# Patient Record
Sex: Male | Born: 1970 | Race: White | Hispanic: No | Marital: Married | Smoking: Former smoker
Health system: Southern US, Community
[De-identification: ages and names within clinical notes are randomized; demographics above are authoritative.]

## PROBLEM LIST (undated history)

## (undated) DIAGNOSIS — M5030 Other cervical disc degeneration, unspecified cervical region: Secondary | ICD-10-CM

## (undated) DIAGNOSIS — Z9889 Other specified postprocedural states: Secondary | ICD-10-CM

## (undated) DIAGNOSIS — G4700 Insomnia, unspecified: Secondary | ICD-10-CM

## (undated) DIAGNOSIS — M5020 Other cervical disc displacement, unspecified cervical region: Secondary | ICD-10-CM

## (undated) DIAGNOSIS — J302 Other seasonal allergic rhinitis: Secondary | ICD-10-CM

## (undated) HISTORY — PX: EYE SURGERY: SHX253

## (undated) HISTORY — DX: Insomnia, unspecified: G47.00

## (undated) HISTORY — DX: Other seasonal allergic rhinitis: J30.2

## (undated) HISTORY — DX: Other specified postprocedural states: Z98.890

---

## 2004-06-04 HISTORY — PX: LASIK: SHX215

## 2004-10-27 ENCOUNTER — Encounter: Admission: RE | Admit: 2004-10-27 | Discharge: 2004-10-27 | Payer: Self-pay | Admitting: Family Medicine

## 2007-03-12 ENCOUNTER — Ambulatory Visit: Payer: Self-pay | Admitting: Family Medicine

## 2008-05-13 ENCOUNTER — Ambulatory Visit: Payer: Self-pay | Admitting: Family Medicine

## 2010-10-10 ENCOUNTER — Ambulatory Visit (INDEPENDENT_AMBULATORY_CARE_PROVIDER_SITE_OTHER): Payer: BC Managed Care – PPO | Admitting: Family Medicine

## 2010-10-10 ENCOUNTER — Encounter: Payer: Self-pay | Admitting: Family Medicine

## 2010-10-10 VITALS — BP 130/80 | HR 84 | Temp 98.3°F | Wt 183.0 lb

## 2010-10-10 DIAGNOSIS — Z720 Tobacco use: Secondary | ICD-10-CM

## 2010-10-10 DIAGNOSIS — J410 Simple chronic bronchitis: Secondary | ICD-10-CM

## 2010-10-10 MED ORDER — AMOXICILLIN 875 MG PO TABS: 875.0000 mg | ORAL_TABLET | Freq: Two times a day (BID) | ORAL | Status: AC

## 2010-10-10 NOTE — Patient Instructions (Signed)
Take all medication. He use over-the-counter medication for coughing specifically try NyQuil at night. She not totally back to normal after 10 days, please call

## 2010-10-10 NOTE — Progress Notes (Signed)
  Subjective:    Patient ID: Manuel Johnson, male    DOB: Sep 24, 1970, 40 y.o.   MRN: [id number]  Cough This is a new problem. The current episode started more than 1 month ago. The problem has been rapidly worsening. The problem occurs constantly. The cough is productive of purulent sputum. Associated symptoms include nasal congestion, postnasal drip, rhinorrhea and shortness of breath. Pertinent negatives include no chills, ear pain or fever. Risk factors for lung disease include smoking/tobacco exposure. He has tried OTC cough suppressant for the symptoms. The treatment provided no relief.      Review of Systems  Constitutional: Positive for fatigue. Negative for fever and chills.  HENT: Positive for rhinorrhea and postnasal drip. Negative for ear pain and sneezing.   Respiratory: Positive for cough and shortness of breath.        Objective:   Physical Exam  Constitutional: He appears well-developed and well-nourished.  HENT:  Head: Normocephalic.  Neck: Normal range of motion.  Cardiovascular: Normal rate and regular rhythm.  Exam reveals no gallop.   Pulmonary/Chest: Effort normal. No respiratory distress. He has no wheezes. He has no rales.          Assessment & Plan:  Bronchitis. Cigarette abuse.

## 2012-02-12 ENCOUNTER — Ambulatory Visit (INDEPENDENT_AMBULATORY_CARE_PROVIDER_SITE_OTHER): Payer: BC Managed Care – PPO | Admitting: Family Medicine

## 2012-02-12 ENCOUNTER — Encounter: Payer: Self-pay | Admitting: Family Medicine

## 2012-02-12 VITALS — BP 110/70 | HR 81 | Wt 194.0 lb

## 2012-02-12 DIAGNOSIS — I8390 Asymptomatic varicose veins of unspecified lower extremity: Secondary | ICD-10-CM

## 2012-02-12 DIAGNOSIS — M79609 Pain in unspecified limb: Secondary | ICD-10-CM

## 2012-02-12 DIAGNOSIS — R079 Chest pain, unspecified: Secondary | ICD-10-CM

## 2012-02-12 DIAGNOSIS — M79669 Pain in unspecified lower leg: Secondary | ICD-10-CM

## 2012-02-12 NOTE — Progress Notes (Signed)
  Subjective:    Patient ID: Manuel Johnson, male    DOB: 11/02/1970, 41 y.o.   MRN: [id number]  HPI Complains of a three-week history of mid chest pain. Pain is worse with breathing and movement. He has no symptoms of indigestion. Food does not cause this to About  2 weeks ago he did have some difficulty with coughing and is coughing up whitish material. He does complain of fatigue but no fever or chills, sore throat or earache. He also complains of one-month history of right upper calf discomfort. He does have a history of varicose veins. He's had no chest pain, shortness of breath.  Review of Systems     Objective:   Physical Exam alert and in no distress. Tympanic membranes and canals are normal. Throat is clear. Tonsils are normal. Neck is supple without adenopathy or thyromegaly. Cardiac exam shows a regular sinus rhythm without murmurs or gallops. Lungs are clear to auscultation. No chest wall tenderness is noted. Exam of the right lower leg does show varicosities. They're nontender. Homans sign is negative. Exam of his knee shows no popliteal fullness or effusion.       Assessment & Plan:  Varicose veins on right leg 1. Chest pain   2. Calf pain   Use 2 Aleve twice per day for the next week and if continued difficulty, return here for reevaluation. For the calf pain, use heat to the area 20 minutes 3 times per day.

## 2012-02-12 NOTE — Patient Instructions (Addendum)
Use 2 Aleve twice per day for the next week and if continued difficulty, return here for reevaluation. For the calf pain, use heat to the area 20 minutes 3 times per day.

## 2012-09-14 ENCOUNTER — Ambulatory Visit: Payer: BC Managed Care – PPO

## 2012-09-14 ENCOUNTER — Ambulatory Visit (INDEPENDENT_AMBULATORY_CARE_PROVIDER_SITE_OTHER): Payer: BC Managed Care – PPO | Admitting: Internal Medicine

## 2012-09-14 VITALS — BP 129/87 | HR 82 | Temp 98.6°F | Resp 16 | Ht 73.75 in | Wt 193.8 lb

## 2012-09-14 DIAGNOSIS — R05 Cough: Secondary | ICD-10-CM

## 2012-09-14 DIAGNOSIS — J329 Chronic sinusitis, unspecified: Secondary | ICD-10-CM

## 2012-09-14 DIAGNOSIS — R042 Hemoptysis: Secondary | ICD-10-CM

## 2012-09-14 MED ORDER — HYDROCODONE-HOMATROPINE 5-1.5 MG/5ML PO SYRP: 5.0000 mL | ORAL_SOLUTION | Freq: Four times a day (QID) | ORAL | Status: DC | PRN

## 2012-09-14 MED ORDER — AMOXICILLIN 500 MG PO CAPS: 1000.0000 mg | ORAL_CAPSULE | Freq: Two times a day (BID) | ORAL | Status: AC

## 2012-09-14 MED ORDER — PREDNISONE 20 MG PO TABS: ORAL_TABLET | ORAL | Status: DC

## 2012-09-14 NOTE — Progress Notes (Signed)
  Subjective:    Patient ID: Manuel Johnson, male    DOB: [birth year], 42 y.o.   MRN: [id number]  HPI  The past three weeks pt complains that he has been blowing his nose and coughing up blood and thick mucous. Yesterday he says the mucous was thick and bright green. He states he has been fatigued and the pressure in his head yesterday. He reports that the morning cough is the worse. He is coughing all of the time. No fever chills or night sweats No weight loss     Review of Systems No gastrointestinal symptoms No skin rash    Objective:   Physical Exam BP 129/87  Pulse 82  Temp(Src) 98.6 F (37 C) (Oral)  Resp 16  Ht 6' 1.75" (1.873 m)  Wt 193 lb 12.8 oz (87.907 kg)  BMI 25.06 kg/m2  SpO2 98% Conjunctiva slightly injected TMs clear Nares boggy/right-sided polyp/no fluid or blood clots/purulent discharge on the right Throat clear No nodes Chest clear to auscultation/minimal wheeze with forced expiration   [facility] reading (PRIMARY) by  Dr. Merla Riches NAD      Assessment & Plan:  Problem #1 sinusitis  Problem #2 cough with hemoptysis  Problem #3 fatigue   Amoxicillin1gmbid 10d Hycodan pred 60-0 over 6 days Reck 10d --needs nasoscopy if not 100% clear

## 2012-10-20 ENCOUNTER — Ambulatory Visit: Payer: BC Managed Care – PPO

## 2012-10-20 ENCOUNTER — Ambulatory Visit (INDEPENDENT_AMBULATORY_CARE_PROVIDER_SITE_OTHER): Payer: BC Managed Care – PPO | Admitting: Family Medicine

## 2012-10-20 VITALS — BP 128/82 | HR 80 | Temp 98.2°F | Resp 17 | Ht 72.5 in | Wt 193.0 lb

## 2012-10-20 DIAGNOSIS — J45909 Unspecified asthma, uncomplicated: Secondary | ICD-10-CM

## 2012-10-20 DIAGNOSIS — R05 Cough: Secondary | ICD-10-CM

## 2012-10-20 DIAGNOSIS — R059 Cough, unspecified: Secondary | ICD-10-CM

## 2012-10-20 DIAGNOSIS — J329 Chronic sinusitis, unspecified: Secondary | ICD-10-CM

## 2012-10-20 LAB — POCT CBC
Granulocyte percent: 70.2 %G (ref 37–80)
Hemoglobin: 15.2 g/dL (ref 14.1–18.1)
Lymph, poc: 1.4 (ref 0.6–3.4)
MCH, POC: 30.3 pg (ref 27–31.2)
MID (cbc): 0.3 (ref 0–0.9)
POC LYMPH PERCENT: 24.1 %L (ref 10–50)
POC MID %: 5.7 %M (ref 0–12)
RBC: 5.01 M/uL (ref 4.69–6.13)
WBC: 5.9 10*3/uL (ref 4.6–10.2)

## 2012-10-20 MED ORDER — LEVOFLOXACIN 500 MG PO TABS: 500.0000 mg | ORAL_TABLET | Freq: Every day | ORAL | Status: DC

## 2012-10-20 MED ORDER — ALBUTEROL SULFATE HFA 108 (90 BASE) MCG/ACT IN AERS: 2.0000 | INHALATION_SPRAY | Freq: Four times a day (QID) | RESPIRATORY_TRACT | Status: DC | PRN

## 2012-10-20 MED ORDER — BENZONATATE 100 MG PO CAPS: ORAL_CAPSULE | ORAL | Status: DC

## 2012-10-20 MED ORDER — GUAIFENESIN-CODEINE 100-10 MG/5ML PO SYRP: ORAL_SOLUTION | ORAL | Status: DC

## 2012-10-20 NOTE — Progress Notes (Signed)
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 He was here a little lower month ago and treated with antibiotics and prednisone. He cleared up some. At that point his chest x-ray was normal. He then started developing a cough back, and has persisted with a cough which is now increasingly productive of green phlegm. He also blows some purulent phlegm from his nose. He does not smoke. He is married and has 2 children but no one else at home is particularly ill. He is in contact with all the many kids at school as he goes in and out of classroom.  He has not been running a fever. He just feels run down and draggy.  Objective: TMs are normal. Throat clear. Neck supple without significant nodes. Chest has rales in the right posterior lower lung. No rhonchi. When he coughs there is a little end-expiratory wheeze to his cough.  Assessment: Cough with right lower lobe rales, rule out pneumonia  Plan: CBC and chest x-ray  [facility] reading (PRIMARY) by  Dr. Alwyn Ren Normal  cxr  Results for orders placed in visit on 10/20/12  POCT CBC      Result Value Range   WBC 5.9  4.6 - 10.2 K/uL   Lymph, poc 1.4  0.6 - 3.4   POC LYMPH PERCENT 24.1  10 - 50 %L   MID (cbc) 0.3  0 - 0.9   POC MID % 5.7  0 - 12 %M   POC Granulocyte 4.1  2 - 6.9   Granulocyte percent 70.2  37 - 80 %G   RBC 5.01  4.69 - 6.13 M/uL   Hemoglobin 15.2  14.1 - 18.1 g/dL   HCT, POC 40.9  81.1 - 53.7 %   MCV 93.3  80 - 97 fL   MCH, POC 30.3  27 - 31.2 pg   MCHC 32.5  31.8 - 35.4 g/dL   RDW, POC 91.4     Platelet Count, POC 310  142 - 424 K/uL   MPV 7.7  0 - 99.8 fL   .

## 2012-10-20 NOTE — Patient Instructions (Signed)
Drink fluids  Get sufficient rest  Use meds as ordered  Return if not improving.

## 2012-11-03 ENCOUNTER — Other Ambulatory Visit: Payer: Self-pay | Admitting: Family Medicine

## 2012-11-06 ENCOUNTER — Ambulatory Visit (INDEPENDENT_AMBULATORY_CARE_PROVIDER_SITE_OTHER): Payer: BC Managed Care – PPO | Admitting: Family Medicine

## 2012-11-06 ENCOUNTER — Encounter: Payer: Self-pay | Admitting: Family Medicine

## 2012-11-06 VITALS — BP 144/90 | HR 82 | Temp 98.6°F | Ht 73.0 in | Wt 188.0 lb

## 2012-11-06 DIAGNOSIS — J019 Acute sinusitis, unspecified: Secondary | ICD-10-CM

## 2012-11-06 MED ORDER — PREDNISONE 20 MG PO TABS: 20.0000 mg | ORAL_TABLET | Freq: Two times a day (BID) | ORAL | Status: DC

## 2012-11-06 MED ORDER — AMOXICILLIN-POT CLAVULANATE 875-125 MG PO TABS: 1.0000 | ORAL_TABLET | Freq: Two times a day (BID) | ORAL | Status: DC

## 2012-11-06 NOTE — Progress Notes (Signed)
Chief Complaint  Patient presents with  . Cough    X 4 MTH HAS BEEN ON 2 ROUNDS OF ANTIBIOTIC W/ PHLEM W/COLOR PT WAS SEEN 09/14/12 AND 10/21/11 AT [facility] WITH CHEST X-RAYS DONE BOTH TIMES   . Fatigue    PT HAS TROUBLE SLEEPING DUE TO COUGH   Former smoker presents with cough x 1 month.  He was seen in UC twice, treated with ABX, but it never completely cleared the cough. Cough was initially productive of yellow phlegm, but is now thick and green, and has been for at least a month. Cough is worse at night.  Having subjective fevers 2x/week, night sweats 3x/wk.  Started having worsening symptoms with fevers/sweats in the last 2 weeks.  Having some nasal congestion, but no nasal drainage or sinus pain, no sore throat, no ear pain/symptoms.  Has decreased appetite, and complaining of fatigue.  Using Nyquil at night since finishing last course of antibiotics. Having some shortness of breath with exertion.  Doesn't use the inhaler--hurts to use it, doesn't notice much improvement.  No past medical history on file.  No past surgical history on file.  History   Social History  . Marital Status: Married    Spouse Name: N/A    Number of Children: N/A  . Years of Education: N/A   Occupational History  . principal [employer]   Social History Main Topics  . Smoking status: Former Smoker -- 1.00 packs/day for 5 years    Quit date: 10/10/2010  . Smokeless tobacco: Not on file  . Alcohol Use: Yes  . Drug Use: No  . Sexually Active: Yes    Birth Control/ Protection: None   Other Topics Concern  . Not on file   Social History Narrative   From [place]   Meds: Nyquil at night. Not using other meds currently.  Has albuterol, not using  No Known Allergies  ROS:  No weight loss, but decreased appetite.  No nausea, vomiting, diarrhea, skin rash or other concerns  PHYSICAL EXAM: BP 144/90  Pulse 82  Ht 6\' 1"  (1.854 m)  Wt 188 lb (85.276 kg)  BMI 24.81 kg/m2  SpO2 97% Well  developed male, in no distress, with occasional dry cough HEENT:  PERRL, conjunctiva clear. TM's and EAC's normal.  Nasal mucosa moderately edematous with erythema and thick yellow mucus seen on left.  Sinuses nontender.  OP clear Neck: no lymphadenopathy or mass Heart: regular rate and rhythm without murmur Lungs: some diminished breath sounds throughout.  No wheezes, rales or ronchi.  No cough or wheeze with forced expiration Skin: no rash  ASSESSMENT/PLAN:  Sinusitis, acute - Plan: amoxicillin-clavulanate (AUGMENTIN) 875-125 MG per tablet, predniSONE (DELTASONE) 20 MG tablet  Sinusitis.  ?if viral in past, and therefore didn't respond to ABX, vs treatment wasn't long enough.  At this point, exam and history is consistent with bacterial sinusitis Treat with augmentin x 2 weeks, plus Mucinex and course of prednisone.  Risks/side effects reviewed.  Return in 2 weeks if not better, sooner if worse. Supportive measures reviewed.  Recommend mucinex and sinus rinses

## 2012-11-06 NOTE — Patient Instructions (Addendum)
Take antibiotics as directed. Take steroids twice daily for 5 days. Use Mucinex as directed to loosen the phlegm.  You may use dextromethorphan along with it if needed for cough (delsym syrup, or DM version of mucinex).  Do not use Mucinex DM along with Nyquil, as some of the ingredients will overlap Consider trying sinus rinses (either neti-pot or sinus rinse kit)--once or twice daily. Return in 2 weeks if not completely better, sooner if worse

## 2013-05-25 ENCOUNTER — Ambulatory Visit (INDEPENDENT_AMBULATORY_CARE_PROVIDER_SITE_OTHER): Payer: BC Managed Care – PPO | Admitting: Physician Assistant

## 2013-05-25 VITALS — BP 118/62 | HR 91 | Temp 98.2°F | Resp 16 | Ht 73.0 in | Wt 196.8 lb

## 2013-05-25 DIAGNOSIS — H6983 Other specified disorders of Eustachian tube, bilateral: Secondary | ICD-10-CM

## 2013-05-25 DIAGNOSIS — J40 Bronchitis, not specified as acute or chronic: Secondary | ICD-10-CM

## 2013-05-25 DIAGNOSIS — J329 Chronic sinusitis, unspecified: Secondary | ICD-10-CM

## 2013-05-25 DIAGNOSIS — R05 Cough: Secondary | ICD-10-CM

## 2013-05-25 DIAGNOSIS — H6980 Other specified disorders of Eustachian tube, unspecified ear: Secondary | ICD-10-CM

## 2013-05-25 MED ORDER — LEVOFLOXACIN 500 MG PO TABS: 500.0000 mg | ORAL_TABLET | Freq: Every day | ORAL | Status: DC

## 2013-05-25 MED ORDER — HYDROCODONE-HOMATROPINE 5-1.5 MG/5ML PO SYRP: ORAL_SOLUTION | ORAL | Status: DC

## 2013-05-25 MED ORDER — PREDNISONE 20 MG PO TABS: ORAL_TABLET | ORAL | Status: DC

## 2013-05-25 MED ORDER — BENZONATATE 200 MG PO CAPS: 200.0000 mg | ORAL_CAPSULE | Freq: Three times a day (TID) | ORAL | Status: DC | PRN

## 2013-05-25 NOTE — Progress Notes (Signed)
Patient ID: Manuel Johnson MRN: [id number], DOB: 06/30/1970, 42 y.o. Date of Encounter: 05/25/2013, 11:54 AM  Primary Physician: Carollee Herter, MD  Chief Complaint:  Chief Complaint  Patient presents with  . Cough    symptoms x 3 weeks   . Nasal Congestion  . Otalgia  . Sore Throat  . Nausea    HPI: 42 y.o. male presents with 3 week history of nasal congestion, post nasal drip, sore throat, sinus pressure, and cough. Afebrile. No chills. Nasal congestion thick and green/yellow. Cough is productive of green/yellow sputum and not associated with time of day. No SOB or wheezing. Ears feel full, leading to sensation of muffled hearing. Right worse than left. Has tried OTC cold preps without success. No GI complaints. Appetite decreased. 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 No recent antibiotics or recent travels. No leg trauma, sedentary periods, h/o cancer, or tobacco use.   History reviewed. No pertinent past medical history.   Home Meds: Prior to Admission medications   Medication Sig Start Date End Date Taking? Authorizing Provider                                Allergies: No Known Allergies  History   Social History  . Marital Status: Married    Spouse Name: N/A    Number of Children: N/A  . Years of Education: N/A   Occupational History  . principal [employer]   Social History Main Topics  . Smoking status: Former Smoker -- 1.00 packs/day for 5 years    Quit date: 10/10/2010  . Smokeless tobacco: Not on file  . Alcohol Use: Yes  . Drug Use: No  . Sexual Activity: Yes    Birth Control/ Protection: None   Other Topics Concern  . Not on file   Social History Narrative   From [place]     Review of Systems: Constitutional: positive for fatigue. negative for chills or fever HEENT: see above Cardiovascular: negative for chest pain or palpitations Respiratory: positive for cough. negative for wheezing, or shortness of  breath Abdominal: positive for nausea. negative for abdominal pain, vomiting or diarrhea Dermatological: negative for rash Neurologic: positive for headache. negative for dizziness     Physical Exam: Blood pressure 118/62, pulse 91, temperature 98.2 F (36.8 C), temperature source Oral, resp. rate 16, height 6\' 1"  (1.854 m), weight 196 lb 12.8 oz (89.268 kg), SpO2 97.00%., Body mass index is 25.97 kg/(m^2). General: Well developed, well nourished, in no acute distress. Head: Normocephalic, atraumatic, eyes without discharge, sclera non-icteric, nares are congested. Bilateral auditory canals clear, TM's are without perforation, pearly grey with reflective cone of light bilaterally. Serous effusion bilaterally behind TM's. Maxillary sinus TTP. Oral cavity moist, dentition normal. Posterior pharynx with post nasal drip and mild erythema. No peritonsillar abscess or tonsillar exudate. Uvula midline.  Neck: Supple. No thyromegaly. Full ROM. No lymphadenopathy. Lungs: Coarse breath sounds bilaterally without wheezes, rales, or rhonchi. Breathing is unlabored.  Heart: RRR with S1 S2. No murmurs, rubs, or gallops appreciated. Msk:  Strength and tone normal for age. Extremities: No clubbing or cyanosis. No edema. Neuro: Alert and oriented X 3. Moves all extremities spontaneously. CNII-XII grossly in tact. Psych:  Responds to questions appropriately with a normal affect.     ASSESSMENT AND PLAN:  42 y.o. male with sinobronchitis, ETD, and cough -Levaquin 500 mg 1 po daily #10 no RF -Prednisone  20 mg #18 3x3, 2x3, 1x3 no RF -Tessalon Perles 200 mg 1 po tid prn cough #60 no RF  -Hycodan #4oz 1 tsp po q 4-6 hours prn cough no RF SED -Mucinex -Tylenol/Motrin prn -Rest/fluids -RTC precautions -RTC 3-5 days if no improvement  Signed, Eula Listen, PA-C [facility] [place], [place] [address] [phone] 05/25/2013 11:54 AM

## 2013-06-04 DIAGNOSIS — G4700 Insomnia, unspecified: Secondary | ICD-10-CM

## 2013-06-04 HISTORY — DX: Insomnia, unspecified: G47.00

## 2013-06-16 ENCOUNTER — Ambulatory Visit (INDEPENDENT_AMBULATORY_CARE_PROVIDER_SITE_OTHER): Payer: BC Managed Care – PPO | Admitting: Family Medicine

## 2013-06-16 ENCOUNTER — Ambulatory Visit: Payer: BC Managed Care – PPO

## 2013-06-16 VITALS — BP 142/86 | HR 82 | Temp 98.8°F | Resp 16 | Ht 72.5 in | Wt 196.0 lb

## 2013-06-16 DIAGNOSIS — R5383 Other fatigue: Secondary | ICD-10-CM

## 2013-06-16 DIAGNOSIS — R059 Cough, unspecified: Secondary | ICD-10-CM

## 2013-06-16 DIAGNOSIS — R05 Cough: Secondary | ICD-10-CM

## 2013-06-16 DIAGNOSIS — J209 Acute bronchitis, unspecified: Secondary | ICD-10-CM

## 2013-06-16 DIAGNOSIS — R531 Weakness: Secondary | ICD-10-CM

## 2013-06-16 DIAGNOSIS — R5381 Other malaise: Secondary | ICD-10-CM

## 2013-06-16 LAB — COMPREHENSIVE METABOLIC PANEL
ALT: 31 U/L (ref 0–53)
AST: 28 U/L (ref 0–37)
Albumin: 4.7 g/dL (ref 3.5–5.2)
Alkaline Phosphatase: 58 U/L (ref 39–117)
BUN: 15 mg/dL (ref 6–23)
CO2: 28 mEq/L (ref 19–32)
Calcium: 9.8 mg/dL (ref 8.4–10.5)
Chloride: 103 mEq/L (ref 96–112)
Creat: 0.95 mg/dL (ref 0.50–1.35)
Glucose, Bld: 108 mg/dL — ABNORMAL HIGH (ref 70–99)
Potassium: 4.6 mEq/L (ref 3.5–5.3)
Sodium: 139 mEq/L (ref 135–145)
Total Bilirubin: 0.6 mg/dL (ref 0.3–1.2)
Total Protein: 7.5 g/dL (ref 6.0–8.3)

## 2013-06-16 LAB — POCT SEDIMENTATION RATE: POCT SED RATE: 30 mm/hr — AB (ref 0–22)

## 2013-06-16 LAB — POCT CBC
Granulocyte percent: 65.9 %G (ref 37–80)
HCT, POC: 45 % (ref 43.5–53.7)
Hemoglobin: 14.5 g/dL (ref 14.1–18.1)
Lymph, poc: 2 (ref 0.6–3.4)
MCH, POC: 30.3 pg (ref 27–31.2)
MCHC: 32.2 g/dL (ref 31.8–35.4)
MCV: 94 fL (ref 80–97)
MID (cbc): 0.4 (ref 0–0.9)
MPV: 8.1 fL (ref 0–99.8)
POC Granulocyte: 4.7 (ref 2–6.9)
POC LYMPH PERCENT: 28 %L (ref 10–50)
POC MID %: 6.1 %M (ref 0–12)
Platelet Count, POC: 358 10*3/uL (ref 142–424)
RBC: 4.79 M/uL (ref 4.69–6.13)
RDW, POC: 13.5 %
WBC: 7.2 10*3/uL (ref 4.6–10.2)

## 2013-06-16 MED ORDER — AZITHROMYCIN 250 MG PO TABS: ORAL_TABLET | ORAL | Status: DC

## 2013-06-16 MED ORDER — HYDROCODONE-HOMATROPINE 5-1.5 MG/5ML PO SYRP: 5.0000 mL | ORAL_SOLUTION | Freq: Three times a day (TID) | ORAL | Status: DC | PRN

## 2013-06-16 NOTE — Progress Notes (Addendum)
Subjective:  This chart was scribed for Manuel SimmerKristi Smith, MD by Carl Bestelina Holson, Medical Scribe. This patient was seen in Room 13 and the patient's care was started at 5:10 PM.   Patient ID: Manuel Johnson, male    DOB: 02/05/1971, 43 y.o.   MRN: [id number]  HPI HPI Comments: Manuel MiyamotoGerald Sassone is a 43 y.o. male who presents to the [facility] complaining of constant cough productive of dark green sputum.  The patient states that he was diagnosed with Bronchitis before Christmas and has taken all necessary medication.  He states that the capsules he was prescribed helped him to stop coughing throughout the day but his symptoms have not improved.  He lists weakness, emesis, and nausea as associated symptoms.  He states that he had an episode of emesis over Christmas break but has not reported another episode since.  He denies fever as an associated symptom. He denies taking any new medication.  He states that his hearing has returned in his right ear.  The patient states that he is an [employer] [place].    Review of Systems  Constitutional: Negative for fever.  Respiratory: Positive for cough.   Gastrointestinal: Positive for nausea and vomiting.  Neurological: Positive for weakness.  All other systems reviewed and are negative.     Objective:  Physical Exam  Nursing note and vitals reviewed. Constitutional: He is oriented to person, place, and time. He appears well-developed and well-nourished.  HENT:  Head: Normocephalic and atraumatic.  Right Ear: External ear normal.  Left Ear: External ear normal.  Eyes: Conjunctivae and EOM are normal. Pupils are equal, round, and reactive to light.  Neck: Normal range of motion and phonation normal.  Cardiovascular: Normal rate, regular rhythm, normal heart sounds and intact distal pulses.   Pulmonary/Chest: Effort normal. No respiratory distress. He exhibits no bony tenderness.  Abdominal: Soft. Normal  appearance.  Musculoskeletal: Normal range of motion.  Neurological: He is alert and oriented to person, place, and time. No cranial nerve deficit or sensory deficit. He exhibits normal muscle tone. Coordination normal.  Skin: Skin is warm, dry and intact.  Psychiatric: He has a normal mood and affect. His behavior is normal. Judgment and thought content normal.   Auscultation of the lungs reveals bibasilar rales and diffuse end expiratory wheezes. No tachypnea or tachycardia Acute distress [facility] reading (PRIMARY) by  Dr. Milus GlazierLauenstein:  CXR.  Diffuse reticular pattern, no infiltrate Results for orders placed in visit on 06/16/13  POCT CBC      Result Value Range   WBC 7.2  4.6 - 10.2 K/uL   Lymph, poc 2.0  0.6 - 3.4   POC LYMPH PERCENT 28.0  10 - 50 %L   MID (cbc) 0.4  0 - 0.9   POC MID % 6.1  0 - 12 %M   POC Granulocyte 4.7  2 - 6.9   Granulocyte percent 65.9  37 - 80 %G   RBC 4.79  4.69 - 6.13 M/uL   Hemoglobin 14.5  14.1 - 18.1 g/dL   HCT, POC 40.945.0  81.143.5 - 53.7 %   MCV 94.0  80 - 97 fL   MCH, POC 30.3  27 - 31.2 pg   MCHC 32.2  31.8 - 35.4 g/dL   RDW, POC 91.413.5     Platelet Count, POC 358  142 - 424 K/uL   MPV 8.1  0 - 99.8 fL   No improvement after nebulizer of  albuterol and atrovent    BP 142/86   Pulse 82   Temp(Src) 98.8 F (37.1 C) (Oral)   Resp 16   Ht 6' 0.5" (1.842 m)   Wt 196 lb (88.905 kg)   BMI 26.20 kg/m2   SpO2 97% Assessment & Plan:  Cough - Plan: DG Chest 2 View, POCT CBC, POCT SEDIMENTATION RATE, HYDROcodone-homatropine (HYCODAN) 5-1.5 MG/5ML syrup, azithromycin (ZITHROMAX Z-PAK) 250 MG tablet, CANCELED: Clostridium difficile EIA  Weakness generalized - Plan: DG Chest 2 View, POCT CBC, POCT SEDIMENTATION RATE, Comprehensive metabolic panel, CANCELED: Clostridium difficile EIA  Signed, Elvina Sidle, MD    I personally performed the services described in this documentation, which was scribed in my presence. The recorded information has been reviewed and  is accurate.

## 2013-06-16 NOTE — Patient Instructions (Signed)

## 2013-10-15 ENCOUNTER — Encounter: Payer: Self-pay | Admitting: Medical

## 2013-10-15 ENCOUNTER — Ambulatory Visit (INDEPENDENT_AMBULATORY_CARE_PROVIDER_SITE_OTHER): Payer: BC Managed Care – PPO | Admitting: Medical

## 2013-10-15 VITALS — BP 120/70 | HR 64 | Temp 98.2°F | Resp 16 | Wt 198.0 lb

## 2013-10-15 DIAGNOSIS — G4700 Insomnia, unspecified: Secondary | ICD-10-CM

## 2013-10-15 NOTE — Progress Notes (Signed)
Subjective: Here for insomnia.  [place] is [place].  Has had trouble with insomnia for 3+ years at least.  He is at his wits end.  Feels like he has done everything he knows or has researched to help with sleep but has been unsuccessful.  He is here as a last resort.    He has changed lifestyle, has tried numerous things OTC, struggling to get to sleep, awakes 2-3 hours later, and can't get back to sleep. He is a type A person, always on the go mentally, a thinker.  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  No prior prescription medication for this issue.   Has used Advil PM, Nyquil, herbal medications, melatonin.  Taking 3 melatonin nightly. Nonsmoker.   Exercises regularly.  Doesn't drink alcohol during the week.  Stops caffeine before 1pm.  He practices all the sleep hygiene measures he is read about but still no improvement. Lives at home with wife and 2 kids and one dog but there is not a lot of disruption or loud noise at bedtime. He does not get up to urinate regularly  In general he is not a pill person but he really is getting depressed and frustrated with not being able to sleep  No past medical history on file.  ROS as in subjective  Objective: Filed Vitals:   10/15/13 1449  BP: 120/70  Pulse: 64  Temp: 98.2 F (36.8 C)  Resp: 16    General appearance: alert, no distress, WD/WN,  Neck: supple, no lymphadenopathy, no thyromegaly, no masses Heart: RRR, normal S1, S2, no murmurs Lungs: CTA bilaterally, no wheezes, rhonchi, or rales Pulses: 2+ symmetric, upper and lower extremities, normal cap refill   Assessment: Encounter Diagnosis  Name Primary?  . Insomnia Yes    Plan: We discussed his concerns. We discussed good sleep hygiene measures that he is already doing. Begin trial of Belsomra 10 mg.  Gave samples 10mg  and 15mg .  We discussed risk and benefits of this medication. Discussed proper use of the medication. I will await his call back to see  how this works

## 2013-10-22 ENCOUNTER — Other Ambulatory Visit: Payer: Self-pay | Admitting: Medical

## 2013-10-22 ENCOUNTER — Telehealth: Payer: Self-pay

## 2013-10-22 MED ORDER — ALPRAZOLAM 0.5 MG PO TABS: 0.5000 mg | ORAL_TABLET | Freq: Every evening | ORAL | Status: DC | PRN

## 2013-10-22 NOTE — Telephone Encounter (Signed)
pls call him and let him know (which he is probably already aware), that I called out Xanax for sleep.  He apparently didn't get much improvement on Belsomra, but please call and inquire about this.  I didn't call out the 20mg  dose.    His pharamcist told me last night that Belsomra would require a prior authorization showing he had been on other sleep aids prior.  Have him use the Xanax (which he told me he had tried prior from his wife that worked), and let me know mid next week if this works better.

## 2013-10-22 NOTE — Telephone Encounter (Signed)
Patient called says Belsomra is working somewhat at 15mg /day, is completely out of rx, it is 5:00, and he wants a rx to be called into [facility], Belsomra 15mg  sig: 1 po q hs # 15, Shane, patient would like to increase this dose if okay with you, please advise.

## 2013-10-23 NOTE — Telephone Encounter (Signed)
Patient states that the Belsomra 10 mg was okay, the Belsomra 15 mg was better but he still woke up in the middle of the night. He wants to try the Belsomra 25 mg but insurance will not cover unless someone calls. He states that he tried the Xanax last night and it did not help. He was tossing and turning through out the night. He states that he was going to double up it tonight and see of that works and he will call back on Tuesday with an up date. CLS

## 2013-10-23 NOTE — Telephone Encounter (Signed)
LMOM TO CB. CLS 

## 2013-10-26 NOTE — Telephone Encounter (Signed)
Give me an update.  We won't be able to get the Belsomra covered unless we have tried at least 2 different alternate medications.

## 2013-10-27 NOTE — Telephone Encounter (Signed)
Patient is going to call with the up date on double ing the Xanax. CLS

## 2013-11-09 ENCOUNTER — Telehealth: Payer: Self-pay | Admitting: Medical

## 2013-11-09 NOTE — Telephone Encounter (Signed)
Needs refill on generic xanax  [facility]

## 2013-11-09 NOTE — Telephone Encounter (Signed)
meds are working  He takes 2   5 minutes before going to bed and it gets him to sleep and he stays asleep  But now he is out

## 2013-11-09 NOTE — Telephone Encounter (Signed)
How is this working?  If not working well, have him come back for f/u to discuss a couple different options.

## 2013-11-10 ENCOUNTER — Other Ambulatory Visit: Payer: Self-pay | Admitting: Medical

## 2013-11-10 MED ORDER — ALPRAZOLAM 1 MG PO TABS: 1.0000 mg | ORAL_TABLET | Freq: Every evening | ORAL | Status: DC | PRN

## 2013-11-10 NOTE — Telephone Encounter (Signed)
I called out xanax to his pharmacy per Crosby Oyster PAC. CLS

## 2013-11-10 NOTE — Progress Notes (Signed)
I called out xanax to his pharmacy per Crosby Oyster PAc and the patient is aware of the change in the dose. CLS

## 2013-11-14 ENCOUNTER — Telehealth: Payer: Self-pay | Admitting: Medical

## 2013-11-16 NOTE — Telephone Encounter (Signed)
We already switched to xanax

## 2013-11-16 NOTE — Telephone Encounter (Signed)
P.A. BELSOMRA denied, pt needs trial & failure of Zaleplon, Zolpidem, Zolpidem ER.  Do you want to switch?

## 2014-01-28 ENCOUNTER — Telehealth: Payer: Self-pay | Admitting: Medical

## 2014-01-29 ENCOUNTER — Other Ambulatory Visit: Payer: Self-pay | Admitting: Family Medicine

## 2014-01-29 MED ORDER — ALPRAZOLAM 1 MG PO TABS: 1.0000 mg | ORAL_TABLET | Freq: Every evening | ORAL | Status: DC | PRN

## 2014-01-29 NOTE — Telephone Encounter (Signed)
I called out the Xanax to his pharmacy per Crosby Oyster PAC. CLS

## 2014-01-29 NOTE — Telephone Encounter (Signed)
Call out the Xanax with 2 refills, and then let us plan on a physical visit.

## 2014-03-08 ENCOUNTER — Other Ambulatory Visit: Payer: Self-pay | Admitting: Family Medicine

## 2014-03-08 ENCOUNTER — Telehealth: Payer: Self-pay | Admitting: Family Medicine

## 2014-03-08 MED ORDER — ALPRAZOLAM 1 MG PO TABS: 1.0000 mg | ORAL_TABLET | Freq: Every evening | ORAL | Status: DC | PRN

## 2014-03-08 NOTE — Telephone Encounter (Signed)
Call out and schedule for a physical.  He is due for routine preventative care and f/u on insomnia.

## 2014-03-08 NOTE — Telephone Encounter (Signed)
Pt called for refill of sleep meds Xanax 1 mg.  I advised pt you may want ov.   [facility]

## 2014-03-08 NOTE — Telephone Encounter (Signed)
I called out Xanax to his pharmacy and I left the patient a message on his voicemail about setting up an appointment for a physical.

## 2014-03-10 ENCOUNTER — Ambulatory Visit (INDEPENDENT_AMBULATORY_CARE_PROVIDER_SITE_OTHER): Payer: BC Managed Care – PPO | Admitting: Medical

## 2014-03-10 ENCOUNTER — Telehealth: Payer: Self-pay | Admitting: Medical

## 2014-03-10 ENCOUNTER — Telehealth: Payer: Self-pay | Admitting: Family Medicine

## 2014-03-10 ENCOUNTER — Encounter: Payer: Self-pay | Admitting: Medical

## 2014-03-10 VITALS — BP 120/82 | HR 78 | Temp 98.4°F | Resp 16 | Wt 195.0 lb

## 2014-03-10 DIAGNOSIS — R5383 Other fatigue: Secondary | ICD-10-CM

## 2014-03-10 DIAGNOSIS — Z23 Encounter for immunization: Secondary | ICD-10-CM

## 2014-03-10 DIAGNOSIS — T485X5A Adverse effect of other anti-common-cold drugs, initial encounter: Secondary | ICD-10-CM

## 2014-03-10 DIAGNOSIS — R5381 Other malaise: Secondary | ICD-10-CM

## 2014-03-10 DIAGNOSIS — G4700 Insomnia, unspecified: Secondary | ICD-10-CM

## 2014-03-10 DIAGNOSIS — R531 Weakness: Secondary | ICD-10-CM

## 2014-03-10 DIAGNOSIS — T485X1A Poisoning by other anti-common-cold drugs, accidental (unintentional), initial encounter: Secondary | ICD-10-CM

## 2014-03-10 DIAGNOSIS — J310 Chronic rhinitis: Secondary | ICD-10-CM

## 2014-03-10 MED ORDER — ALPRAZOLAM 1 MG PO TABS: 1.0000 mg | ORAL_TABLET | Freq: Every evening | ORAL | Status: DC | PRN

## 2014-03-10 MED ORDER — ZOLPIDEM TARTRATE 10 MG PO TABS: 10.0000 mg | ORAL_TABLET | Freq: Every evening | ORAL | Status: DC | PRN

## 2014-03-10 MED ORDER — CICLESONIDE 50 MCG/ACT NA SUSP: 2.0000 | Freq: Every day | NASAL | Status: DC

## 2014-03-10 NOTE — Telephone Encounter (Signed)
Manuel Johnson, I spoke with the pharmacy and the Xanax was called out 2 days ago by us. Did you mean Ambien?

## 2014-03-10 NOTE — Telephone Encounter (Signed)
I called out Xanax to the patients pharmacy per [facility].

## 2014-03-10 NOTE — Telephone Encounter (Signed)
Sorry, I didn't realize we just called it out.  Hold off then as I gave [place].

## 2014-03-10 NOTE — Progress Notes (Signed)
Subjective: Here for f/u on insomnia.  At last visit was having lots of problems getting to sleep . 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   Since last visit Xanax at bedtime works great.  Takes 1/2 tablet about 7pm, then the other 1/2 right before bedtime.   This works well.   He has questions about the medication.    Has ongoing nasal congestion throughout the year without sneezing, itching, been taking Afrin daily for last 3 weeks.  Not sneezing.  Laser eye surgery in the past.   Takes eye drops daily for itchy eyes.    Feels generally decreased in energy.  Saw urgent care for similar in January.   No other c/o.    Past Medical History  Diagnosis Date  . Insomnia 2015   Review of Systems Constitutional: -fever, -chills, -sweats, -unexpected weight change, +fatigue ENT: -runny nose, -ear pain, -sore throat Cardiology:  -chest pain, -palpitations, -edema Respiratory: -cough, -shortness of breath, -wheezing Gastroenterology: -abdominal pain, -nausea, -vomiting, -diarrhea, -constipation  Hematology: -bleeding or bruising problems Musculoskeletal: -arthralgias, -myalgias, -joint swelling, -back pain Ophthalmology: -vision changes Urology: -dysuria, -difficulty urinating, -hematuria, -urinary frequency, -urgency Neurology: -headache, -weakness, -tingling, -numbness     Obesity: BP 120/82  Pulse 78  Temp(Src) 98.4 F (36.9 C) (Oral)  Resp 16  Wt 195 lb (88.451 kg)  General appearance: alert, no distress, WD/WN HEENT: normocephalic, sclerae anicteric, TMs pearly, nares with moderate turbinated edema, clear discharge, no erythema, pharynx normal Oral cavity: MMM, no lesions Neck: supple, no lymphadenopathy, no thyromegaly, no masses Heart: RRR, normal S1, S2, no murmurs Lungs: CTA bilaterally, no wheezes, rhonchi, or rales Abdomen: +bs, soft, non tender, non distended, no masses, no  hepatomegaly, no splenomegaly Pulses: 2+ symmetric, upper and lower extremities, normal cap refill Neuro: nonfocal   Assessment:  Encounter Diagnoses  Name Primary?  . Insomnia Yes  . General weakness   . Malaise and fatigue   . Flu vaccine need   . Rhinitis medicamentosa    Plan: Labs today.  Begin sample of Omnaris nasal to wean off Afrin.  Wean off Afrin using every other day for a week then stop.  Consider multivatmin and B12 supplement daily.  C/t healthy diet, exercise and good water intake.    Discussed insomnia.  He will use trial of Ambien for alternate treatment.   But if no change/no worse/no better, then we will c/t Xanax QHS which has worked well.  Counseled on the influenza virus vaccine.  Vaccine information sheet given.  Influenza vaccine given after consent obtained.

## 2014-03-10 NOTE — Telephone Encounter (Signed)
done

## 2014-03-11 ENCOUNTER — Telehealth: Payer: Self-pay | Admitting: Medical

## 2014-03-11 LAB — TSH: TSH: 1.43 u[IU]/mL (ref 0.350–4.500)

## 2014-03-11 LAB — BASIC METABOLIC PANEL
BUN: 14 mg/dL (ref 6–23)
CALCIUM: 9.5 mg/dL (ref 8.4–10.5)
CO2: 26 meq/L (ref 19–32)
CREATININE: 0.84 mg/dL (ref 0.50–1.35)
Chloride: 102 mEq/L (ref 96–112)
Glucose, Bld: 87 mg/dL (ref 70–99)
POTASSIUM: 4.2 meq/L (ref 3.5–5.3)
SODIUM: 137 meq/L (ref 135–145)

## 2014-03-11 LAB — HEMOGLOBIN A1C
Hgb A1c MFr Bld: 5.8 % — ABNORMAL HIGH (ref <5.7)
MEAN PLASMA GLUCOSE: 120 mg/dL — AB (ref <117)

## 2014-03-11 LAB — SEDIMENTATION RATE: SED RATE: 4 mm/h (ref 0–16)

## 2014-03-11 LAB — VITAMIN B12: Vitamin B-12: 384 pg/mL (ref 211–911)

## 2014-03-19 NOTE — Telephone Encounter (Signed)
tsd  °

## 2014-04-20 ENCOUNTER — Telehealth: Payer: Self-pay | Admitting: Internal Medicine

## 2014-04-20 ENCOUNTER — Other Ambulatory Visit: Payer: Self-pay | Admitting: Medical

## 2014-04-20 MED ORDER — ALPRAZOLAM 1 MG PO TABS: 1.0000 mg | ORAL_TABLET | Freq: Every evening | ORAL | Status: DC | PRN

## 2014-04-20 MED ORDER — ZOLPIDEM TARTRATE 10 MG PO TABS: 10.0000 mg | ORAL_TABLET | Freq: Every evening | ORAL | Status: DC | PRN

## 2014-04-20 NOTE — Telephone Encounter (Signed)
I called Xanax and Ambien per [facility]avid [facility]

## 2014-04-20 NOTE — Telephone Encounter (Signed)
Ok, then call out both xanax and Ambien, I refilled both electronically.

## 2014-04-20 NOTE — Telephone Encounter (Signed)
Patient is aware of the  2 medications that was called out to his pharmacy.

## 2014-04-20 NOTE — Telephone Encounter (Signed)
Which worked better, the Ambien or the xanax?  Lets stick with the better of the 2.  Also, is [facility], was he able to get off the Afrin?

## 2014-04-20 NOTE — Telephone Encounter (Signed)
Patient states he uses half of the xanax to calm him down and then he will use half of the Ambien to help him sleep and combination has been helping him out. He has used all of the Omnaris and doesn't need anymore and he doesn't use Afrin anymore.

## 2014-04-20 NOTE — Telephone Encounter (Signed)
Pt needs a refill on xanax and ambien [facility]

## 2014-06-16 ENCOUNTER — Telehealth: Payer: Self-pay | Admitting: Medical

## 2014-06-16 NOTE — Telephone Encounter (Signed)
Needs refill on ambien and xanax     [facility]

## 2014-06-16 NOTE — Telephone Encounter (Signed)
I spoke with the patient and he was unaware that he had additional refills at the pharmacy so he said that he will call the pharmacy and the refills.

## 2014-06-16 NOTE — Telephone Encounter (Signed)
On 04/20/14 I refilled xanax for 3 months which would last until 07/21/13.  I also gave trial of Ambien to use as alternative for sleep prn, gave 30 + 1 refill.  This was to see if Ambien worked better or different than xanax for sleep.  So he shouldn't be out of xanax for sure.     Please inquire.  Too early for refills.

## 2014-09-10 ENCOUNTER — Telehealth: Payer: Self-pay | Admitting: Medical

## 2014-09-10 NOTE — Telephone Encounter (Signed)
Pt needs refill Xanax to [facility], pt no longer taking Ambien because it makes him feel drowsy in the morning, only taking Xanax

## 2014-09-13 ENCOUNTER — Other Ambulatory Visit: Payer: Self-pay | Admitting: Family Medicine

## 2014-09-13 MED ORDER — ALPRAZOLAM 1 MG PO TABS: 1.0000 mg | ORAL_TABLET | Freq: Every evening | ORAL | Status: DC | PRN

## 2014-09-13 NOTE — Telephone Encounter (Signed)
Call out xanax with 2 refills, and plan a baseline physical for June

## 2014-09-13 NOTE — Telephone Encounter (Signed)
I called out Xanax to his pharmacy per Crosby Oysteravid Tysinger [facility]

## 2014-10-29 ENCOUNTER — Ambulatory Visit (INDEPENDENT_AMBULATORY_CARE_PROVIDER_SITE_OTHER): Payer: BC Managed Care – PPO | Admitting: Family Medicine

## 2014-10-29 ENCOUNTER — Encounter: Payer: Self-pay | Admitting: Family Medicine

## 2014-10-29 VITALS — BP 130/88 | HR 88 | Temp 99.4°F | Ht 72.0 in | Wt 196.8 lb

## 2014-10-29 DIAGNOSIS — J019 Acute sinusitis, unspecified: Secondary | ICD-10-CM

## 2014-10-29 MED ORDER — AMOXICILLIN-POT CLAVULANATE 875-125 MG PO TABS: 1.0000 | ORAL_TABLET | Freq: Two times a day (BID) | ORAL | Status: DC

## 2014-10-29 NOTE — Progress Notes (Signed)
Chief Complaint  Patient presents with  . Cough    started in his sinuses and has moved into his chest. Producing bright green mucus. Making him nauseous. Symptom started about 3 weeks ago. No known fevers. Also complains of right ear pain.   He started 3 weeks ago with congestion that he thought was allergies, continued using his Flonase.  He then started feeling sinus pain on the right side, which has now moved to his right ear.  He started taking sudafed to help with the sinus pain (just this week), but still having headaches, sinus pain and ear pain. He also has nausea, fatigue, and some chest congestion.  Nasal mucus is bright and dark green, coughing up green phlegm throughout the day. Denies chest pain or shortness of breath.  No known fever or chills. +sick contact--son with ear infection (6 yo)  PMH, PSH, SH reviewed  Outpatient Encounter Prescriptions as of 10/29/2014  Medication Sig  . ALPRAZolam (XANAX) 1 MG tablet Take 1 tablet (1 mg total) by mouth at bedtime as needed for anxiety.  Marland Kitchen. dextromethorphan (DELSYM) 30 MG/5ML liquid Take 30 mg by mouth as needed for cough.  . fluticasone (FLONASE) 50 MCG/ACT nasal spray Place 2 sprays into both nostrils daily.  Marland Kitchen. oxymetazoline (AFRIN) 0.05 % nasal spray Place 1 spray into both nostrils 2 (two) times daily.  . pseudoephedrine (SUDAFED) 30 MG tablet Take 30 mg by mouth every 4 (four) hours as needed for congestion.  . [DISCONTINUED] ciclesonide (OMNARIS) 50 MCG/ACT nasal spray Place 2 sprays into both nostrils daily. (Patient not taking: Reported on 10/29/2014)  . [DISCONTINUED] zolpidem (AMBIEN) 10 MG tablet Take 1 tablet (10 mg total) by mouth at bedtime as needed for sleep. (Patient not taking: Reported on 10/29/2014)   No facility-administered encounter medications on file as of 10/29/2014.   No Known Allergies  ROS: +headaches. +fatigue.  Denies dyspnea on exertion. No vomiting or diarrhea, +nausea. No rash, bleeding/bruising. No  chest pain, palpitations.  PHYSICAL EXAM: BP 130/88 mmHg  Pulse 88  Temp(Src) 99.4 F (37.4 C) (Tympanic)  Ht 6' (1.829 m)  Wt 196 lb 12.8 oz (89.268 kg)  BMI 26.69 kg/m2  Well appearing male, who sounds congested, and has some throat-clearing and sniffling. He is in no distress HEENT: PERRL, EOMI conjunctiva clear. TM's and EAc's normal. Nasal mucosa is moderately edematous L>R, with erythema. No purulence noted. Sinuses are nontender.  OP is clear Neck: no lymphadenopathy or mass Heart: regular rate and rhythm without murmur Lungs: clear bilaterally (had some ronchi at first, cleared with coughing up some yellow phlegm) Skin: no rash Neuro: alert and oriented. Cranial nerves intact, normal strength, gait  ASSESSMENT/PLAN:  Acute sinusitis, recurrence not specified, unspecified location - Plan: amoxicillin-clavulanate (AUGMENTIN) 875-125 MG per tablet   Given son's recent antibiotic use, will use Augmentin rather than amoxil. Given length of symptoms, treat for 2 weeks (he may stop after 10 days if completely better).  Take the antibiotics twice daily as directed.  If you develop diarrhea you can take a probiotic with it, and use imodium if needed. Drink plenty of fluids. Add Mucinex round the clock Continue sudafed as needed for sinus pain, and delsym only if needed for cough. Use tylenol or ibuprofen as needed for pain and/or fever. Expect color of the mucus to improve after the first few day.  If by Wed or Thursday of next week you are not starting to feel better (especially if you are WORSE), please give  [place] a call.  Otherwise, expect to see some improvement by middle of next week, may take up to 2 weeks for complete improvement.  Energy might also take about a week to improve.

## 2014-10-29 NOTE — Patient Instructions (Signed)
  Take the antibiotics twice daily as directed.  If you develop diarrhea you can take a probiotic with it, and use imodium if needed. Drink plenty of fluids. Add Mucinex round the clock Continue sudafed as needed for sinus pain, and delsym only if needed for cough. Use tylenol or ibuprofen as needed for pain and/or fever. Expect color of the mucus to improve after the first few day.  If by Wed or Thursday of next week you are not starting to feel better (especially if you are WORSE), please give us a call.  Otherwise, expect to see some improvement by middle of next week, may take up to 2 weeks for complete improvement.  Energy might also take about a week to improve.

## 2014-10-30 ENCOUNTER — Encounter: Payer: Self-pay | Admitting: Family Medicine

## 2014-12-13 ENCOUNTER — Encounter: Payer: Self-pay | Admitting: Medical

## 2014-12-13 ENCOUNTER — Ambulatory Visit (INDEPENDENT_AMBULATORY_CARE_PROVIDER_SITE_OTHER): Payer: BC Managed Care – PPO | Admitting: Medical

## 2014-12-13 VITALS — BP 120/78 | HR 76 | Temp 98.0°F | Resp 16 | Ht 72.0 in | Wt 199.0 lb

## 2014-12-13 DIAGNOSIS — H579 Unspecified disorder of eye and adnexa: Secondary | ICD-10-CM | POA: Diagnosis not present

## 2014-12-13 DIAGNOSIS — Z0000 Encounter for general adult medical examination without abnormal findings: Secondary | ICD-10-CM | POA: Diagnosis not present

## 2014-12-13 DIAGNOSIS — Z9889 Other specified postprocedural states: Secondary | ICD-10-CM

## 2014-12-13 DIAGNOSIS — J309 Allergic rhinitis, unspecified: Secondary | ICD-10-CM

## 2014-12-13 DIAGNOSIS — G4700 Insomnia, unspecified: Secondary | ICD-10-CM | POA: Insufficient documentation

## 2014-12-13 DIAGNOSIS — Z23 Encounter for immunization: Secondary | ICD-10-CM

## 2014-12-13 LAB — COMPREHENSIVE METABOLIC PANEL
ALBUMIN: 4.7 g/dL (ref 3.5–5.2)
ALK PHOS: 46 U/L (ref 39–117)
ALT: 27 U/L (ref 0–53)
AST: 26 U/L (ref 0–37)
BILIRUBIN TOTAL: 0.7 mg/dL (ref 0.2–1.2)
BUN: 17 mg/dL (ref 6–23)
CHLORIDE: 100 meq/L (ref 96–112)
CO2: 28 meq/L (ref 19–32)
Calcium: 9.9 mg/dL (ref 8.4–10.5)
Creat: 0.9 mg/dL (ref 0.50–1.35)
Glucose, Bld: 103 mg/dL — ABNORMAL HIGH (ref 70–99)
Potassium: 4.9 mEq/L (ref 3.5–5.3)
Sodium: 137 mEq/L (ref 135–145)
TOTAL PROTEIN: 7.4 g/dL (ref 6.0–8.3)

## 2014-12-13 LAB — LIPID PANEL
Cholesterol: 243 mg/dL — ABNORMAL HIGH (ref 0–200)
HDL: 69 mg/dL (ref >=40)
LDL Cholesterol: 158 mg/dL — ABNORMAL HIGH (ref 0–99)
Total CHOL/HDL Ratio: 3.5 Ratio
Triglycerides: 80 mg/dL (ref <150)
VLDL: 16 mg/dL (ref 0–40)

## 2014-12-13 MED ORDER — ALPRAZOLAM 1 MG PO TABS: 1.0000 mg | ORAL_TABLET | Freq: Every evening | ORAL | Status: DC | PRN

## 2014-12-13 MED ORDER — TRAZODONE HCL 50 MG PO TABS: 25.0000 mg | ORAL_TABLET | Freq: Every evening | ORAL | Status: DC | PRN

## 2014-12-13 NOTE — Progress Notes (Signed)
Subjective:   HPI  Manuel Johnson is a 44 y.o. male who presents for a complete physical.   Preventative care:here Last ophthalmology visit: 10 years ago Dr Manuel Johnson Last dental visit:2 times Dr Manuel Johnson Last colonoscopy:never Last prostate exam:never  Last ZOX:WRUEAEKG:never Last labs:here  Prior vaccinations:current TD or Tdap:2004 Influenza: gets yearly Pneumococcal: none  Advanced directive:yes Health care power of attorney:yes Living will:yes  Concerns: Sleep problems, feel like he has to stay on Xanax.   Wants to try something else.   Reviewed their medical, surgical, family, social, medication, and allergy history and updated chart as appropriate.  Past Medical History  Diagnosis Date  . Insomnia 2015  . Seasonal allergies   . Wears contact lenses     Past Surgical History  Procedure Laterality Date  . No past surgeries      as of 12/2014    History   Social History  . Marital Status: Married    Spouse Name: N/A  . Number of Children: N/A  . Years of Education: N/A   Occupational History  . principal [employer]   Social History Main Topics  . Smoking status: Former Smoker -- 1.00 packs/day for 5 years    Quit date: 10/10/2010  . Smokeless tobacco: Not on file  . Alcohol Use: 3.6 oz/week    6 Cans of beer per week     Comment: 6 drinks per week (weekends)  . Drug Use: No  . Sexual Activity: Yes    Birth Control/ Protection: None   Other Topics Concern  . Not on file   Social History Narrative   From [place]. 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  Exercise 4 days per week, walking , running, weights.    Family History  Problem Relation Age of Onset  . Alzheimer's disease Mother   . Heart disease Mother     angina  . Alcohol abuse Father   . Cancer Neg Hx   . Stroke Neg Hx   . Hypertension Neg Hx   . Diabetes Neg Hx      Current outpatient prescriptions:  .  ALPRAZolam (XANAX) 1 MG tablet, Take 1 tablet (1 mg  total) by mouth at bedtime as needed for anxiety., Disp: 30 tablet, Rfl: 2 .  fluticasone (FLONASE) 50 MCG/ACT nasal spray, Place 2 sprays into both nostrils daily., Disp: , Rfl:   No Known Allergies     Review of Systems Constitutional: -fever, -chills, -sweats, -unexpected weight change, -decreased appetite, -fatigue Allergy: -sneezing, -itching, -congestion Dermatology: -changing moles, --rash, -lumps ENT: -runny nose, -ear pain, -sore throat, -hoarseness, -sinus pain, -teeth pain, - ringing in ears, -hearing loss, -nosebleeds Cardiology: -chest pain, -palpitations, -swelling, -difficulty breathing when lying flat, -waking up short of breath Respiratory: -cough, -shortness of breath, -difficulty breathing with exercise or exertion, -wheezing, -coughing up blood Gastroenterology: -abdominal pain, -nausea, -vomiting, -diarrhea, -constipation, -blood in stool, -changes in bowel movement, -difficulty swallowing or eating Hematology: -bleeding, -bruising  Musculoskeletal: -joint aches, -muscle aches, -joint swelling, -back pain, -neck pain, -cramping, -changes in gait Ophthalmology: denies vision changes, eye redness, itching, discharge Urology: -burning with urination, -difficulty urinating, -blood in urine, -urinary frequency, -urgency, -incontinence Neurology: -headache, -weakness, -tingling, -numbness, -memory loss, -falls, -dizziness Psychology: -depressed mood, -agitation, +sleep problems     Objective:   Physical Exam  BP 120/78 mmHg  Pulse 76  Temp(Src) 98 F (36.7 C) (Oral)  Resp 16  Ht 6' (1.829 m)  Wt 199 lb (90.266  kg)  BMI 26.98 kg/m2  General appearance: alert, no distress, WD/WN, white male Skin: some stuck on brown/pink lesions of left upper and mid back suggestive of seborrheic keratosis, scattered benign appearing macules, arms, legs, back, abdomen, freckles upper torso, no particular worrisome lesions HEENT: normocephalic, conjunctiva/corneas normal, sclerae  anicteric, PERRLA, EOMi, nares patent, no discharge or erythema, pharynx normal Oral cavity: MMM, tongue normal, teeth normal Neck: supple, no lymphadenopathy, no thyromegaly, no masses, normal ROM, no bruits Chest: non tender, normal shape and expansion Heart: RRR, normal S1, S2, no murmurs Lungs: CTA bilaterally, no wheezes, rhonchi, or rales Abdomen: +bs, soft, non tender, non distended, no masses, no hepatomegaly, no splenomegaly, no bruits Back: non tender, normal ROM, no scoliosis Musculoskeletal: upper extremities non tender, no obvious deformity, normal ROM throughout, lower extremities non tender, no obvious deformity, normal ROM throughout Extremities: no edema, no cyanosis, no clubbing Pulses: 2+ symmetric, upper and lower extremities, normal cap refill Neurological: alert, oriented x 3, CN2-12 intact, strength normal upper extremities and lower extremities, sensation normal throughout, DTRs 2+ throughout, no cerebellar signs, gait normal Psychiatric: normal affect, behavior normal, pleasant  GU: normal male external genitalia, uncircumcised, nontender, no masses, no hernia, no lymphadenopathy Rectal: deferred   Assessment and Plan :    Encounter Diagnoses  Name Primary?  . Encounter for health maintenance examination in adult Yes  . Insomnia   . Allergic rhinitis, unspecified allergic rhinitis type   . Abnormal vision screen   . S/P LASIK surgery   . Need for Tdap vaccination     Physical exam - discussed healthy lifestyle, diet, exercise, preventative care, vaccinations, and addressed their concerns.   See your eye doctor yearly for routine vision care. See your dentist yearly for routine dental care including hygiene visits twice yearly. Insomnia - he will check coverage for sleep study.  For now trial of Trazodone, wean off Xanax, lets see how this works.  discussed risks/benefits of medication Allergic rhinitis - c/t Flonase Counseled on the Tdap (tetanus, diptheria,  and acellular pertussis) vaccine.  Vaccine information sheet given. Tdap vaccine given after consent obtained. Follow-up pending labs

## 2014-12-13 NOTE — Addendum Note (Signed)
Addended by: Lilli LightLOMAX, Evolette Pendell G on: 12/13/2014 09:57 AM   Modules accepted: Orders

## 2014-12-13 NOTE — Patient Instructions (Signed)
Call [facility] Address: [address], [place], [place] [address] Phone: [phone]   Recommendations: See your eye doctor yearly for routine vision care. See your dentist yearly for routine dental care including hygiene visits twice yearly. We will call with lab results Begin Trazodone, 1/2 -1 tablet at bedtime for sleep For the first few nights on Trazodone, take 1/2 of the Xanax at bedtime After 4-5 days, just take Trazodone at night for sleep  Consider sleep study

## 2014-12-14 LAB — CBC
HCT: 43.1 % (ref 39.0–52.0)
Hemoglobin: 14.8 g/dL (ref 13.0–17.0)
MCH: 30.2 pg (ref 26.0–34.0)
MCHC: 34.3 g/dL (ref 30.0–36.0)
MCV: 88 fL (ref 78.0–100.0)
MPV: 9 fL (ref 8.6–12.4)
Platelets: 297 10*3/uL (ref 150–400)
RBC: 4.9 MIL/uL (ref 4.22–5.81)
RDW: 13.9 % (ref 11.5–15.5)
WBC: 6.2 10*3/uL (ref 4.0–10.5)

## 2015-08-29 ENCOUNTER — Ambulatory Visit (INDEPENDENT_AMBULATORY_CARE_PROVIDER_SITE_OTHER): Payer: BC Managed Care – PPO | Admitting: Family Medicine

## 2015-08-29 ENCOUNTER — Ambulatory Visit
Admission: RE | Admit: 2015-08-29 | Discharge: 2015-08-29 | Disposition: A | Discharge status: Home / Self Care | Payer: BC Managed Care – PPO | Source: Ambulatory Visit | Attending: Family Medicine | Admitting: Family Medicine

## 2015-08-29 ENCOUNTER — Encounter: Payer: Self-pay | Admitting: Family Medicine

## 2015-08-29 VITALS — BP 124/86 | HR 80 | Temp 100.0°F | Ht 72.0 in | Wt 197.0 lb

## 2015-08-29 DIAGNOSIS — R52 Pain, unspecified: Secondary | ICD-10-CM

## 2015-08-29 DIAGNOSIS — R509 Fever, unspecified: Secondary | ICD-10-CM

## 2015-08-29 DIAGNOSIS — R05 Cough: Secondary | ICD-10-CM

## 2015-08-29 DIAGNOSIS — R059 Cough, unspecified: Secondary | ICD-10-CM

## 2015-08-29 DIAGNOSIS — R6883 Chills (without fever): Secondary | ICD-10-CM | POA: Diagnosis not present

## 2015-08-29 DIAGNOSIS — J209 Acute bronchitis, unspecified: Secondary | ICD-10-CM

## 2015-08-29 LAB — POC INFLUENZA A&B (BINAX/QUICKVUE)
Influenza A, POC: NEGATIVE
Influenza B, POC: NEGATIVE

## 2015-08-29 MED ORDER — AZITHROMYCIN 250 MG PO TABS: ORAL_TABLET | ORAL | Status: DC

## 2015-08-29 NOTE — Progress Notes (Signed)
Chief Complaint  Patient presents with  . Chills    started last Thursday with chills and fever-temp was 102 Friday. Mucus is very discolored dark brown. Coughing a lot extremely fatigued. Thinks he may have flu.     4-5 days ago he started with chills, body aches--even his eyes hurt.  He had fever up to 102. He has been coughing, sore throat.  Phlegm was light green at first, but now brown.  He has some nausea, which he relates to postnasal drainage.  He is very fatigued. He is currently using Mucinex DM. Last dose of tylenol was last night.  He used dayquil/nyquil at first. He denies runny nose, but has persistent postnasal drainage.  +headache on the top of his head, feels detached. Slight pressure in his forehead.  No sick household contacts, but many people sent home from school with the flu-like symptoms over the past few weeks.  +nausea, diarrhea. Having 6-7 stools/day, sometimes watery, other times just loose. No vomiting.  PMH, PSH, SH reviewed.  Outpatient Encounter Prescriptions as of 08/29/2015  Medication Sig  . Dextromethorphan-Guaifenesin (MUCINEX DM PO) Take 2 tablets by mouth daily.  Marland Kitchen azithromycin (ZITHROMAX) 250 MG tablet Take 2 tablets by mouth on first day, then 1 tablet by mouth on days 2 through 5  . [DISCONTINUED] ALPRAZolam (XANAX) 1 MG tablet Take 1 tablet (1 mg total) by mouth at bedtime as needed for anxiety.  . [DISCONTINUED] fluticasone (FLONASE) 50 MCG/ACT nasal spray Place 2 sprays into both nostrils daily.  . [DISCONTINUED] traZODone (DESYREL) 50 MG tablet Take 0.5-1 tablets (25-50 mg total) by mouth at bedtime as needed for sleep.   No facility-administered encounter medications on file as of 08/29/2015.   (zpak rx'd today, not prior to today's visit).  No Known Allergies  ROS:  No chest pain, shortness of breath.  +URI symptoms, fever/chills, myalgias, nausea and diarrhea as per HPI. No urinary complaints, bleeding, bruising, rash  PHYSICAL  EXAM: BP 124/86 mmHg  Pulse 80  Temp(Src) 100 F (37.8 C) (Tympanic)  Ht 6' (1.829 m)  Wt 197 lb (89.359 kg)  BMI 26.71 kg/m2 Well appearing male in no distress.  Sounds nasal/congested.  No coughing or sniffling during visit. HEENT: PERRL, EOMI, conjunctiva and sclera are clear. Nasal mucosa was mild-mod edematous, L>R, slightly red, but no purulence noted. Sinuses nontender. OP is clear. Neck: no lymphadenopathy, thyromegaly or mass Lungs:  Wheeze/squeak heard on inspiration on entire right side, even after coughing. Clear with good air movement on the left. No rales. Skin: normal turgor, no rash Neuro: alert and oriented, normal cranial nerves, strength, gait  Influenza tests negative  ASSESSMENT/PLAN:  Acute bronchitis, unspecified organism - Plan: azithromycin (ZITHROMAX) 250 MG tablet  Fever, unspecified - Plan: POC Influenza A&B (Binax test), DG Chest 2 View  Chills - Plan: POC Influenza A&B (Binax test)  Body aches - Plan: POC Influenza A&B (Binax test)  Cough - Plan: POC Influenza A&B (Binax test), DG Chest 2 View  CXR--bronchitic changes. Given worsening symptoms, treat with zpak. Continue mucinex. It doesn't sound as though add'l cough meds are needed at this time. Continue supportive measures (for fever control, pain)   Drink plenty of fluids. Use acetaminophen or ibuprofen or aleve as needed for headache, pain, body aches and/or fever. Use decongestants only if needed for sinus pressure, congestion, also may help with the postnasal drainage.  Be sure that you aren't getting dextromethorphan from multiple medications (ie the mucinex and whatever cold medicine  you have). Plain sudafed would be fine to take with the Mucinex DM.  We will call you later today with your x-ray results and let you know the plan.  Avoid dairy and eat a bland diet until your bowels are back to normal.  Consider taking a probiotic.

## 2015-08-29 NOTE — Patient Instructions (Signed)
   Drink plenty of fluids. Use acetaminophen or ibuprofen or aleve as needed for headache, pain, body aches and/or fever. Use decongestants only if needed for sinus pressure, congestion, also may help with the postnasal drainage.  Be sure that you aren't getting dextromethorphan from multiple medications (ie the mucinex and whatever cold medicine you have). Plain sudafed would be fine to take with the Mucinex DM.  We will call you later today with your x-ray results and let you know the plan.  Avoid dairy and eat a bland diet until your bowels are back to normal.  Consider taking a probiotic.

## 2016-03-19 ENCOUNTER — Ambulatory Visit (INDEPENDENT_AMBULATORY_CARE_PROVIDER_SITE_OTHER): Payer: BC Managed Care – PPO | Admitting: Family Medicine

## 2016-03-19 VITALS — BP 122/72 | HR 92 | Temp 98.6°F | Resp 17 | Ht 72.5 in | Wt 191.0 lb

## 2016-03-19 DIAGNOSIS — R05 Cough: Secondary | ICD-10-CM

## 2016-03-19 DIAGNOSIS — J0110 Acute frontal sinusitis, unspecified: Secondary | ICD-10-CM | POA: Diagnosis not present

## 2016-03-19 DIAGNOSIS — R059 Cough, unspecified: Secondary | ICD-10-CM

## 2016-03-19 MED ORDER — AMOXICILLIN-POT CLAVULANATE 875-125 MG PO TABS: 1.0000 | ORAL_TABLET | Freq: Two times a day (BID) | ORAL | 0 refills | Status: DC

## 2016-03-19 MED ORDER — HYDROCODONE-HOMATROPINE 5-1.5 MG/5ML PO SYRP: ORAL_SOLUTION | ORAL | 0 refills | Status: DC

## 2016-03-19 MED ORDER — BENZONATATE 100 MG PO CAPS: 100.0000 mg | ORAL_CAPSULE | Freq: Three times a day (TID) | ORAL | 0 refills | Status: DC | PRN

## 2016-03-19 NOTE — Patient Instructions (Addendum)
Start Augmentin for sinus infection. Saline or salt water nasal spray throughout the day, and okay to continue Mucinex. If you are not improving towards the end of this week, or any worsening sooner, return for recheck.  For your cough, Tessalon Perles up to 3 times per day, and hydrocodone cough syrup at nighttime as needed.   Sinusitis, Adult Sinusitis is redness, soreness, and inflammation of the paranasal sinuses. Paranasal sinuses are air pockets within the bones of your face. They are located beneath your eyes, in the middle of your forehead, and above your eyes. In healthy paranasal sinuses, mucus is able to drain out, and air is able to circulate through them by way of your nose. However, when your paranasal sinuses are inflamed, mucus and air can become trapped. This can allow bacteria and other germs to grow and cause infection. Sinusitis can develop quickly and last only a short time (acute) or continue over a long period (chronic). Sinusitis that lasts for more than 12 weeks is considered chronic. CAUSES Causes of sinusitis include:  Allergies.  Structural abnormalities, such as displacement of the cartilage that separates your nostrils (deviated septum), which can decrease the air flow through your nose and sinuses and affect sinus drainage.  Functional abnormalities, such as when the small hairs (cilia) that line your sinuses and help remove mucus do not work properly or are not present. SIGNS AND SYMPTOMS Symptoms of acute and chronic sinusitis are the same. The primary symptoms are pain and pressure around the affected sinuses. Other symptoms include:  Upper toothache.  Earache.  Headache.  Bad breath.  Decreased sense of smell and taste.  A cough, which worsens when you are lying flat.  Fatigue.  Fever.  Thick drainage from your nose, which often is green and may contain pus (purulent).  Swelling and warmth over the affected sinuses. DIAGNOSIS Your health  care provider will perform a physical exam. During your exam, your health care provider may perform any of the following to help determine if you have acute sinusitis or chronic sinusitis:  Look in your nose for signs of abnormal growths in your nostrils (nasal polyps).  Tap over the affected sinus to check for signs of infection.  View the inside of your sinuses using an imaging device that has a light attached (endoscope). If your health care provider suspects that you have chronic sinusitis, one or more of the following tests may be recommended:  Allergy tests.  Nasal culture. A sample of mucus is taken from your nose, sent to a lab, and screened for bacteria.  Nasal cytology. A sample of mucus is taken from your nose and examined by your health care provider to determine if your sinusitis is related to an allergy. TREATMENT Most cases of acute sinusitis are related to a viral infection and will resolve on their own within 10 days. Sometimes, medicines are prescribed to help relieve symptoms of both acute and chronic sinusitis. These may include pain medicines, decongestants, nasal steroid sprays, or saline sprays. However, for sinusitis related to a bacterial infection, your health care provider will prescribe antibiotic medicines. These are medicines that will help kill the bacteria causing the infection. Rarely, sinusitis is caused by a fungal infection. In these cases, your health care provider will prescribe antifungal medicine. For some cases of chronic sinusitis, surgery is needed. Generally, these are cases in which sinusitis recurs more than 3 times per year, despite other treatments. HOME CARE INSTRUCTIONS  Drink plenty of water. Water helps  thin the mucus so your sinuses can drain more easily.  Use a humidifier.  Inhale steam 3-4 times a day (for example, sit in the bathroom with the shower running).  Apply a warm, moist washcloth to your face 3-4 times a day, or as directed  by your health care provider.  Use saline nasal sprays to help moisten and clean your sinuses.  Take medicines only as directed by your health care provider.  If you were prescribed either an antibiotic or antifungal medicine, finish it all even if you start to feel better. SEEK IMMEDIATE MEDICAL CARE IF:  You have increasing pain or severe headaches.  You have nausea, vomiting, or drowsiness.  You have swelling around your face.  You have vision problems.  You have a stiff neck.  You have difficulty breathing.   This information is not intended to replace advice given to you by your health care provider. Make sure you discuss any questions you have with your health care provider.   Document Released: 05/21/2005 Document Revised: 06/11/2014 Document Reviewed: 06/05/2011 Elsevier Interactive Patient Education 2016 Elsevier Inc.  Cough, Adult Coughing is a reflex that clears your throat and your airways. Coughing helps to heal and protect your lungs. It is normal to cough occasionally, but a cough that happens with other symptoms or lasts a long time may be a sign of a condition that needs treatment. A cough may last only 2-3 weeks (acute), or it may last longer than 8 weeks (chronic). CAUSES Coughing is commonly caused by:  Breathing in substances that irritate your lungs.  A viral or bacterial respiratory infection.  Allergies.  Asthma.  Postnasal drip.  Smoking.  Acid backing up from the stomach into the esophagus (gastroesophageal reflux).  Certain medicines.  Chronic lung problems, including COPD (or rarely, lung cancer).  Other medical conditions such as heart failure. HOME CARE INSTRUCTIONS  Pay attention to any changes in your symptoms. Take these actions to help with your discomfort:  Take medicines only as told by your health care provider.  If you were prescribed an antibiotic medicine, take it as told by your health care provider. Do not stop taking  the antibiotic even if you start to feel better.  Talk with your health care provider before you take a cough suppressant medicine.  Drink enough fluid to keep your urine clear or pale yellow.  If the air is dry, use a cold steam vaporizer or humidifier in your bedroom or your home to help loosen secretions.  Avoid anything that causes you to cough at work or at home.  If your cough is worse at night, try sleeping in a semi-upright position.  Avoid cigarette smoke. If you smoke, quit smoking. If you need help quitting, ask your health care provider.  Avoid caffeine.  Avoid alcohol.  Rest as needed. SEEK MEDICAL CARE IF:   You have new symptoms.  You cough up pus.  Your cough does not get better after 2-3 weeks, or your cough gets worse.  You cannot control your cough with suppressant medicines and you are losing sleep.  You develop pain that is getting worse or pain that is not controlled with pain medicines.  You have a fever.  You have unexplained weight loss.  You have night sweats. SEEK IMMEDIATE MEDICAL CARE IF:  You cough up blood.  You have difficulty breathing.  Your heartbeat is very fast.   This information is not intended to replace advice given to you by your  health care provider. Make sure you discuss any questions you have with your health care provider.   Document Released: 11/17/2010 Document Revised: 02/09/2015 Document Reviewed: 07/28/2014 Elsevier Interactive Patient Education 2016 [employer]    IF you received an x-ray today, you will receive an invoice from [facility]. Please contact [facility] at [phone] with questions or concerns regarding your invoice.   IF you received labwork today, you will receive an invoice from [facility]. Please contact [facility] at [phone] with questions or concerns regarding your invoice.   Our billing staff will not be able to assist you with questions  regarding bills from these companies.  You will be contacted with the lab results as soon as they are available. The fastest way to get your results is to activate your My Chart account. Instructions are located on the last page of this paperwork. If you have not heard from us regarding the results in 2 weeks, please contact this office.

## 2016-03-19 NOTE — Progress Notes (Signed)
Subjective:  By signing my name below, I, Stann Ore, attest that this documentation has been prepared under the direction and in the presence of Meredith Staggers, MD. Electronically Signed: Stann Ore, Scribe. 03/19/2016 , 10:42 AM .  Patient was seen in Room 8 .   Patient ID: Manuel Johnson, male    DOB: 04-18-1971, 45 y.o.   MRN: [id number] Chief Complaint  Patient presents with  . Sinusitis   HPI Manuel Johnson is a 45 y.o. male Here with sinus pressure. Patient states having sinus pressure with headache, lightheadedness with dizziness and nausea. He's had these symptoms for 4 days now. Over the weekend, he's taken OTC medication, Mucinex-D, and Vicks nasal spray without relief. He also feels subjective fever with sweats, but his temperature and oxygen were both normal at triage. He's also been coughing up yellowish green discoloration with sore throat for about 2 weeks now. He notes his cough has been keeping him up at night. He's taken zpak for relief in the past. He denies chest pain, or chest pressure. He denies history of heart disease.   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   Patient Active Problem List   Diagnosis Date Noted  . Insomnia 12/13/2014  . Rhinitis, allergic 12/13/2014  . S/P LASIK surgery 12/13/2014  . Abnormal vision screen 12/13/2014   Past Medical History:  Diagnosis Date  . Insomnia 2015  . S/P LASIK surgery   . Seasonal allergies    Past Surgical History:  Procedure Laterality Date  . LASIK  2006   No Known Allergies Prior to Admission medications   Not on File   Social History   Social History  . Marital status: Married    Spouse name: N/A  . Number of children: N/A  . Years of education: N/A   Occupational History  . principal [employer]   Social History Main Topics  . Smoking status: Former Smoker    Packs/day: 1.00    Years: 5.00    Quit date: 10/10/2010  . Smokeless tobacco: Not on file  . Alcohol use  3.6 oz/week    6 Cans of beer per week     Comment: 6 drinks per week (weekends)  . Drug use: No  . Sexual activity: Yes    Birth control/ protection: None   Other Topics Concern  . Not on file   Social History Narrative   From [place]. 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  Ranae Plumber, [name] 5yo.  Exercise 4 days per week, walking , running, weights.   Review of Systems  Constitutional: Positive for diaphoresis, fatigue and fever. Negative for chills.  HENT: Positive for congestion, sinus pressure and sore throat.   Respiratory: Positive for cough. Negative for chest tightness, shortness of breath and wheezing.   Cardiovascular: Negative for chest pain.  Gastrointestinal: Positive for nausea. Negative for diarrhea and vomiting.  Neurological: Positive for dizziness, light-headedness and headaches. Negative for weakness.       Objective:   Physical Exam  Constitutional: He is oriented to person, place, and time. He appears well-developed and well-nourished.  HENT:  Head: Normocephalic and atraumatic.  Right Ear: Tympanic membrane, external ear and ear canal normal.  Left Ear: Tympanic membrane, external ear and ear canal normal.  Nose: No rhinorrhea. Right sinus exhibits frontal sinus tenderness. Left sinus exhibits frontal sinus tenderness.  Mouth/Throat: Oropharynx is clear and moist and mucous membranes are normal. No oropharyngeal exudate or posterior  oropharyngeal erythema.  Eyes: Conjunctivae and EOM are normal. Pupils are equal, round, and reactive to light. Right eye exhibits no nystagmus. Left eye exhibits no nystagmus.  Neck: Neck supple.  Cardiovascular: Normal rate, regular rhythm, normal heart sounds and intact distal pulses.   No murmur heard. Pulmonary/Chest: Effort normal and breath sounds normal. No respiratory distress. He has no wheezes. He has no rhonchi. He has no rales.  Abdominal: Soft. There is no tenderness.  Lymphadenopathy:    He  has no cervical adenopathy.  Neurological: He is alert and oriented to person, place, and time.  Skin: Skin is warm and dry. No rash noted.  Psychiatric: He has a normal mood and affect. His behavior is normal.  Vitals reviewed.   Vitals:   03/19/16 0928  BP: 122/72  Pulse: 92  Resp: 17  Temp: 98.6 F (37 C)  TempSrc: Oral  SpO2: 98%  Weight: 191 lb (86.6 kg)  Height: 6' 0.5" (1.842 m)      Assessment & Plan:    Manuel Johnson is a 45 y.o. male Acute non-recurrent frontal sinusitis - Plan: amoxicillin-clavulanate (AUGMENTIN) 875-125 MG tablet  Cough - Plan: benzonatate (TESSALON) 100 MG capsule, HYDROcodone-homatropine (HYCODAN) 5-1.5 MG/5ML syrup  Suspected frontal sinusitis after initial viral illness and postnasal drip. Dizziness likely related to congestion/eustachian tube dysfunction. However also taking decongestant, so advised to stop Mucinex D as well as any nasal sprays that may have a decongestant in it.   -Start Augmentin, saline nasal spray. If nasal congestion persists next 5-7 days, and was taking a nasal decongestant, may need prednisone.  - Stop Mucinex D, okay to take Mucinex.  - start Tessalon Perles during day for cough, Hycodan cough syrup at night if needed. Side effects discussed. RTC precautions discussed.    Meds ordered this encounter  Medications  . amoxicillin-clavulanate (AUGMENTIN) 875-125 MG tablet    Sig: Take 1 tablet by mouth 2 (two) times daily.    Dispense:  20 tablet    Refill:  0  . benzonatate (TESSALON) 100 MG capsule    Sig: Take 1 capsule (100 mg total) by mouth 3 (three) times daily as needed for cough.    Dispense:  20 capsule    Refill:  0  . HYDROcodone-homatropine (HYCODAN) 5-1.5 MG/5ML syrup    Sig: 2.5 to 5ml by mouth a bedtime as needed for cough.    Dispense:  120 mL    Refill:  0   Patient Instructions    Start Augmentin for sinus infection. Saline or salt water nasal spray throughout the day, and okay to  continue Mucinex. If you are not improving towards the end of this week, or any worsening sooner, return for recheck.  For your cough, Tessalon Perles up to 3 times per day, and hydrocodone cough syrup at nighttime as needed.   Sinusitis, Adult Sinusitis is redness, soreness, and inflammation of the paranasal sinuses. Paranasal sinuses are air pockets within the bones of your face. They are located beneath your eyes, in the middle of your forehead, and above your eyes. In healthy paranasal sinuses, mucus is able to drain out, and air is able to circulate through them by way of your nose. However, when your paranasal sinuses are inflamed, mucus and air can become trapped. This can allow bacteria and other germs to grow and cause infection. Sinusitis can develop quickly and last only a short time (acute) or continue over a long period (chronic). Sinusitis that lasts for more than  12 weeks is considered chronic. CAUSES Causes of sinusitis include:  Allergies.  Structural abnormalities, such as displacement of the cartilage that separates your nostrils (deviated septum), which can decrease the air flow through your nose and sinuses and affect sinus drainage.  Functional abnormalities, such as when the small hairs (cilia) that line your sinuses and help remove mucus do not work properly or are not present. SIGNS AND SYMPTOMS Symptoms of acute and chronic sinusitis are the same. The primary symptoms are pain and pressure around the affected sinuses. Other symptoms include:  Upper toothache.  Earache.  Headache.  Bad breath.  Decreased sense of smell and taste.  A cough, which worsens when you are lying flat.  Fatigue.  Fever.  Thick drainage from your nose, which often is green and may contain pus (purulent).  Swelling and warmth over the affected sinuses. DIAGNOSIS Your health care provider will perform a physical exam. During your exam, your health care provider may perform any of  the following to help determine if you have acute sinusitis or chronic sinusitis:  Look in your nose for signs of abnormal growths in your nostrils (nasal polyps).  Tap over the affected sinus to check for signs of infection.  View the inside of your sinuses using an imaging device that has a light attached (endoscope). If your health care provider suspects that you have chronic sinusitis, one or more of the following tests may be recommended:  Allergy tests.  Nasal culture. A sample of mucus is taken from your nose, sent to a lab, and screened for bacteria.  Nasal cytology. A sample of mucus is taken from your nose and examined by your health care provider to determine if your sinusitis is related to an allergy. TREATMENT Most cases of acute sinusitis are related to a viral infection and will resolve on their own within 10 days. Sometimes, medicines are prescribed to help relieve symptoms of both acute and chronic sinusitis. These may include pain medicines, decongestants, nasal steroid sprays, or saline sprays. However, for sinusitis related to a bacterial infection, your health care provider will prescribe antibiotic medicines. These are medicines that will help kill the bacteria causing the infection. Rarely, sinusitis is caused by a fungal infection. In these cases, your health care provider will prescribe antifungal medicine. For some cases of chronic sinusitis, surgery is needed. Generally, these are cases in which sinusitis recurs more than 3 times per year, despite other treatments. HOME CARE INSTRUCTIONS  Drink plenty of water. Water helps thin the mucus so your sinuses can drain more easily.  Use a humidifier.  Inhale steam 3-4 times a day (for example, sit in the bathroom with the shower running).  Apply a warm, moist washcloth to your face 3-4 times a day, or as directed by your health care provider.  Use saline nasal sprays to help moisten and clean your sinuses.  Take  medicines only as directed by your health care provider.  If you were prescribed either an antibiotic or antifungal medicine, finish it all even if you start to feel better. SEEK IMMEDIATE MEDICAL CARE IF:  You have increasing pain or severe headaches.  You have nausea, vomiting, or drowsiness.  You have swelling around your face.  You have vision problems.  You have a stiff neck.  You have difficulty breathing.   This information is not intended to replace advice given to you by your health care provider. Make sure you discuss any questions you have with your health care  provider.   Document Released: 05/21/2005 Document Revised: 06/11/2014 Document Reviewed: 06/05/2011 Elsevier Interactive Patient Education 2016 Elsevier Inc.  Cough, Adult Coughing is a reflex that clears your throat and your airways. Coughing helps to heal and protect your lungs. It is normal to cough occasionally, but a cough that happens with other symptoms or lasts a long time may be a sign of a condition that needs treatment. A cough may last only 2-3 weeks (acute), or it may last longer than 8 weeks (chronic). CAUSES Coughing is commonly caused by:  Breathing in substances that irritate your lungs.  A viral or bacterial respiratory infection.  Allergies.  Asthma.  Postnasal drip.  Smoking.  Acid backing up from the stomach into the esophagus (gastroesophageal reflux).  Certain medicines.  Chronic lung problems, including COPD (or rarely, lung cancer).  Other medical conditions such as heart failure. HOME CARE INSTRUCTIONS  Pay attention to any changes in your symptoms. Take these actions to help with your discomfort:  Take medicines only as told by your health care provider.  If you were prescribed an antibiotic medicine, take it as told by your health care provider. Do not stop taking the antibiotic even if you start to feel better.  Talk with your health care provider before you take a  cough suppressant medicine.  Drink enough fluid to keep your urine clear or pale yellow.  If the air is dry, use a cold steam vaporizer or humidifier in your bedroom or your home to help loosen secretions.  Avoid anything that causes you to cough at work or at home.  If your cough is worse at night, try sleeping in a semi-upright position.  Avoid cigarette smoke. If you smoke, quit smoking. If you need help quitting, ask your health care provider.  Avoid caffeine.  Avoid alcohol.  Rest as needed. SEEK MEDICAL CARE IF:   You have new symptoms.  You cough up pus.  Your cough does not get better after 2-3 weeks, or your cough gets worse.  You cannot control your cough with suppressant medicines and you are losing sleep.  You develop pain that is getting worse or pain that is not controlled with pain medicines.  You have a fever.  You have unexplained weight loss.  You have night sweats. SEEK IMMEDIATE MEDICAL CARE IF:  You cough up blood.  You have difficulty breathing.  Your heartbeat is very fast.   This information is not intended to replace advice given to you by your health care provider. Make sure you discuss any questions you have with your health care provider.   Document Released: 11/17/2010 Document Revised: 02/09/2015 Document Reviewed: 07/28/2014 Elsevier Interactive Patient Education 2016 [employer].    IF you received an x-ray today, you will receive an invoice from [facility]. Please contact [facility] at [phone] with questions or concerns regarding your invoice.   IF you received labwork today, you will receive an invoice from [facility]. Please contact [facility] at [phone] with questions or concerns regarding your invoice.   Our billing staff will not be able to assist you with questions regarding bills from these companies.  You will be contacted with the lab results as soon as they are  available. The fastest way to get your results is to activate your My Chart account. Instructions are located on the last page of this paperwork. If you have not heard from [place] regarding the results in 2 weeks, please contact this office.  I personally performed the services described in this documentation, which was scribed in my presence. The recorded information has been reviewed and considered, and addended by me as needed.   Signed,   Meredith Staggers, MD [facility].  03/19/16 10:52 AM

## 2016-04-02 ENCOUNTER — Ambulatory Visit (INDEPENDENT_AMBULATORY_CARE_PROVIDER_SITE_OTHER): Payer: BC Managed Care – PPO | Admitting: Physician Assistant

## 2016-04-02 VITALS — BP 120/78 | HR 71 | Temp 97.8°F | Resp 17 | Ht 72.0 in | Wt 192.0 lb

## 2016-04-02 DIAGNOSIS — J0111 Acute recurrent frontal sinusitis: Secondary | ICD-10-CM

## 2016-04-02 DIAGNOSIS — R059 Cough, unspecified: Secondary | ICD-10-CM

## 2016-04-02 DIAGNOSIS — R05 Cough: Secondary | ICD-10-CM

## 2016-04-02 MED ORDER — MOXIFLOXACIN HCL 400 MG PO TABS: 400.0000 mg | ORAL_TABLET | Freq: Every day | ORAL | 0 refills | Status: AC

## 2016-04-02 MED ORDER — HYDROCODONE-HOMATROPINE 5-1.5 MG/5ML PO SYRP: ORAL_SOLUTION | ORAL | 0 refills | Status: DC

## 2016-04-02 MED ORDER — PREDNISONE 20 MG PO TABS
ORAL_TABLET | ORAL | 0 refills | Status: DC
Start: 2016-04-02 — End: 2016-08-29

## 2016-04-02 NOTE — Progress Notes (Signed)
Subjective:    Patient ID: Manuel Johnson, male    DOB: 02/26/1971, 45 y.o.   MRN: [id number]  HPI: Presents today for persistent sinus and upper respiratory symptoms. He was seen by Dr. Chilton SiGreen 2 weeks ago and dx with frontal sinusitis with rx of Azithromycin. Patient states he completed the course of the antibiotic and has been using the [employer] Perles and Hycodan for cough suppressant which have provided some relief of the cough. Noted some initial improvement of his symptoms while taking the antibiotic. However, he states he has had continued PND, nasal congestion and frontal sinus pressure as well as weakness, chills, and mild sore throat. States he has felt fatigued, had decreased appetite, and nausea with no vomiting. Denies chest pain or chest tightness, SOB, wheezing. Denies GI or urinary complaints. States he has been unable to sleep because of the cough keeping him up at night. Did not receive a flu vaccine.   Review of Systems  Constitutional: Positive for activity change, appetite change, chills and fatigue. Negative for diaphoresis, fever and unexpected weight change.  HENT: Positive for congestion, postnasal drip, sinus pressure and sore throat. Negative for ear discharge, ear pain, rhinorrhea, sneezing, tinnitus and trouble swallowing.   Eyes: Negative for pain, discharge, redness, itching and visual disturbance.  Respiratory: Positive for cough. Negative for chest tightness, shortness of breath and wheezing.   Cardiovascular: Negative for chest pain and palpitations.  Gastrointestinal: Positive for nausea. Negative for abdominal pain, blood in stool, constipation, diarrhea and vomiting.  Genitourinary: Negative for dysuria, frequency, hematuria and urgency.  Skin: Negative for rash.  Allergic/Immunologic: Positive for environmental allergies.  Neurological: Positive for dizziness, weakness, light-headedness and headaches.   No Known Allergies  Prior to Admission medications     Medication Sig Start Date End Date Taking? Authorizing Provider  benzonatate (TESSALON) 100 MG capsule Take 1 capsule (100 mg total) by mouth 3 (three) times daily as needed for cough. 03/19/16  Yes Shade FloodJeffrey R Greene, MD   Patient Active Problem List   Diagnosis Date Noted  . Insomnia 12/13/2014  . Rhinitis, allergic 12/13/2014  . S/P LASIK surgery 12/13/2014  . Abnormal vision screen 12/13/2014       Objective:   Physical Exam  Constitutional: He is oriented to person, place, and time. He appears well-developed and well-nourished.  Non-toxic appearance. No distress.  HENT:  Head: Normocephalic and atraumatic.  Right Ear: No drainage or tenderness. No mastoid tenderness. Tympanic membrane is not injected, not scarred, not perforated, not erythematous, not retracted and not bulging. No middle ear effusion. No hemotympanum. No decreased hearing is noted.  Left Ear: No drainage or tenderness. No mastoid tenderness. Tympanic membrane is not injected, not scarred, not perforated, not erythematous, not retracted and not bulging.  No middle ear effusion. No hemotympanum. No decreased hearing is noted.  Nose: Rhinorrhea present. No mucosal edema, sinus tenderness, septal deviation or nasal septal hematoma. Right sinus exhibits frontal sinus tenderness. Right sinus exhibits no maxillary sinus tenderness. Left sinus exhibits frontal sinus tenderness. Left sinus exhibits no maxillary sinus tenderness.  Mouth/Throat: Uvula is midline, oropharynx is clear and moist and mucous membranes are normal. Mucous membranes are not pale, not dry and not cyanotic. Normal dentition. No oropharyngeal exudate, posterior oropharyngeal edema or posterior oropharyngeal erythema.  Eyes: Conjunctivae are normal. Pupils are equal, round, and reactive to light. Right eye exhibits no discharge and no exudate. Left eye exhibits no discharge and no exudate. Right conjunctiva is not injected. Right conjunctiva  has no hemorrhage.  Left conjunctiva is not injected. Left conjunctiva has no hemorrhage. No scleral icterus. Right pupil is round and reactive. Left pupil is round and reactive. Pupils are equal.  Neck: Trachea normal, normal range of motion and phonation normal. Neck supple. No tracheal deviation and no edema present. No thyromegaly present.  Cardiovascular: Normal rate, regular rhythm and intact distal pulses.  PMI is not displaced.   Pulmonary/Chest: Effort normal and breath sounds normal. No accessory muscle usage. No respiratory distress. He has no decreased breath sounds. He has no wheezes. He has no rhonchi. He has no rales.  Abdominal: Soft. Normal appearance and bowel sounds are normal. He exhibits no distension. There is no tenderness. There is no rebound and no guarding.  Lymphadenopathy:       Head (right side): No submental, no submandibular, no tonsillar, no preauricular, no posterior auricular and no occipital adenopathy present.       Head (left side): No submental, no submandibular, no tonsillar, no preauricular, no posterior auricular and no occipital adenopathy present.    He has no cervical adenopathy.  Neurological: He is alert and oriented to person, place, and time.  Skin: Skin is warm and dry. No rash noted. He is not diaphoretic.  Psychiatric: He has a normal mood and affect. His speech is normal and behavior is normal.       Assessment & Plan:  1. Acute recurrent frontal sinusitis Likely recurrent secondary bacterial sinusitis. Rx Prednisone taper pack and Moxifloxacin. Instructed patient to RTC if symptoms worsen or do not resolve with medication and at that time CT of sinuses may be warranted.  - predniSONE (DELTASONE) 20 MG tablet; Take 3 PO QAM x3days, 2 PO QAM x3days, 1 PO QAM x3days  Dispense: 18 tablet; Refill: 0 - moxifloxacin (AVELOX) 400 MG tablet; Take 1 tablet (400 mg total) by mouth daily.  Dispense: 10 tablet; Refill: 0  2. Cough Continue Hycodan as needed for cough at  bedtime. Side effects reviewed. - HYDROcodone-homatropine (HYCODAN) 5-1.5 MG/5ML syrup; 2.5 to 5ml by mouth a bedtime as needed for cough.  Dispense: 120 mL; Refill: 0

## 2016-04-02 NOTE — Progress Notes (Signed)
Patient ID: Manuel Johnson, male    DOB: 02/28/1971, 45 y.o.   MRN: [id number]  PCP: Carollee HerterLALONDE,[name] CHARLES, MD  Subjective:   Chief Complaint  Patient presents with  . Follow-up    No better  . Nausea    Lightheaded, can't sleep    HPI Presents for evaluation of sinusitis symptoms.  He presented here 2 weeks ago with 4 days of sinus pressure, headache, lightheadedness, dizziness and nausea. OTC products had not helped. Cough  X 2 weeks was productive of yellow-green sputum and disrupting his sleep. On exam he was noted to have frontal sinus tenderness and was diagnosed with acute sinusitis. He was prescribed augmentin, and reports that his symptoms improved initially. Unfortunately, when he completed the course of antibiotic, the symptoms recurred.  His currently describes drainage, sinus pressure, generalized malaise, chills and sore throat. Cough continues to keep him awake at night. Some nausea, but no vomiting. No CP, SOB.  No diarrhea.  He has not received the seasonal influenza vaccine. 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    Review of Systems Constitutional: Positive for activity change, appetite change, chills and fatigue. Negative for diaphoresis, fever and unexpected weight change.  HENT: Positive for congestion, postnasal drip, sinus pressure and sore throat. Negative for ear discharge, ear pain, rhinorrhea, sneezing, tinnitus and trouble swallowing.   Eyes: Negative for pain, discharge, redness, itching and visual disturbance.  Respiratory: Positive for cough. Negative for chest tightness, shortness of breath and wheezing.   Cardiovascular: Negative for chest pain and palpitations.  Gastrointestinal: Positive for nausea. Negative for abdominal pain, blood in stool, constipation, diarrhea and vomiting.  Genitourinary: Negative for dysuria, frequency, hematuria and urgency.  Skin: Negative for rash.  Allergic/Immunologic: Positive  for environmental allergies.  Neurological: Positive for dizziness, weakness, light-headedness and headaches.     Patient Active Problem List   Diagnosis Date Noted  . Insomnia 12/13/2014  . Rhinitis, allergic 12/13/2014  . S/P LASIK surgery 12/13/2014  . Abnormal vision screen 12/13/2014     Prior to Admission medications   Medication Sig Start Date End Date Taking? Authorizing Provider  benzonatate (TESSALON) 100 MG capsule Take 1 capsule (100 mg total) by mouth 3 (three) times daily as needed for cough. 03/19/16  Yes Shade FloodJeffrey R Greene, MD  HYDROcodone-homatropine [facility](HYCODAN) 5-1.5 MG/5ML syrup 2.5 to 5ml by mouth a bedtime as needed for cough. 03/19/16  Yes Shade FloodJeffrey R Greene, MD     No Known Allergies     Objective:  Physical Exam  Constitutional: He is oriented to person, place, and time. He appears well-developed and well-nourished. He is active and cooperative. No distress.  BP 120/78 (BP Location: Right Arm, Patient Position: Sitting, Cuff Size: Normal)   Pulse 71   Temp 97.8 F (36.6 C) (Oral)   Resp 17   Ht 6' (1.829 m)   Wt 192 lb (87.1 kg)   SpO2 98%   BMI 26.04 kg/m   HENT:  Head: Normocephalic and atraumatic.  Right Ear: Hearing, tympanic membrane, external ear and ear canal normal.  Left Ear: Hearing, tympanic membrane, external ear and ear canal normal.  Nose: Rhinorrhea present. No mucosal edema. Right sinus exhibits frontal sinus tenderness. Right sinus exhibits no maxillary sinus tenderness. Left sinus exhibits frontal sinus tenderness. Left sinus exhibits no maxillary sinus tenderness.  Mouth/Throat: Uvula is midline, oropharynx is clear and moist and mucous membranes are normal. No uvula swelling.  Eyes: Conjunctivae are normal. No  scleral icterus.  Neck: Normal range of motion, full passive range of motion without pain and phonation normal. Neck supple. No thyromegaly present.  Cardiovascular: Normal rate, regular rhythm and normal heart sounds.     Pulses:      Radial pulses are 2+ on the right side, and 2+ on the left side.  Pulmonary/Chest: Effort normal and breath sounds normal.  Lymphadenopathy:       Head (right side): No tonsillar, no preauricular, no posterior auricular and no occipital adenopathy present.       Head (left side): No tonsillar, no preauricular, no posterior auricular and no occipital adenopathy present.    He has no cervical adenopathy.       Right: No supraclavicular adenopathy present.       Left: No supraclavicular adenopathy present.  Neurological: He is alert and oriented to person, place, and time. No sensory deficit.  Skin: Skin is warm, dry and intact. No rash noted. No cyanosis or erythema. Nails show no clubbing.  Psychiatric: He has a normal mood and affect. His speech is normal and behavior is normal.           Assessment & Plan:   1. Acute recurrent frontal sinusitis Suspect that the Augmentin was appropriate antibacterial therapy but that the congestion did not resolve adequately and allowed for recurrence. moxifloxacin and prednisone. Rest. Hydrate. Plan CT scan if symptoms persist/recur. Also recommend that he use routine allergy maintenance therapy to prevent recurrence. - predniSONE (DELTASONE) 20 MG tablet; Take 3 PO QAM x3days, 2 PO QAM x3days, 1 PO QAM x3days  Dispense: 18 tablet; Refill: 0 - moxifloxacin (AVELOX) 400 MG tablet; Take 1 tablet (400 mg total) by mouth daily.  Dispense: 10 tablet; Refill: 0  2. Cough Supportive care. - HYDROcodone-homatropine (HYCODAN) 5-1.5 MG/5ML syrup; 2.5 to 5ml by mouth a bedtime as needed for cough.  Dispense: 120 mL; Refill: 0   Fernande Brashelle S. Cincere Zorn, PA-C Physician Assistant-Certified [facility]

## 2016-04-02 NOTE — Patient Instructions (Addendum)
Continue the saline nasal spray. You may use the Tessalon Perles for cough. Get plenty of rest and drink at least 64 ounces of water daily. If the symptoms persist, or recur, we'll want to get a CT scan.    IF you received an x-ray today, you will receive an invoice from [facility]. Please contact [facility] at [phone] with questions or concerns regarding your invoice.   IF you received labwork today, you will receive an invoice from [facility]. Please contact [facility] at [phone] with questions or concerns regarding your invoice.   Our billing staff will not be able to assist you with questions regarding bills from these companies.  You will be contacted with the lab results as soon as they are available. The fastest way to get your results is to activate your My Chart account. Instructions are located on the last page of this paperwork. If you have not heard from us regarding the results in 2 weeks, please contact this office.

## 2016-04-10 ENCOUNTER — Ambulatory Visit (INDEPENDENT_AMBULATORY_CARE_PROVIDER_SITE_OTHER): Payer: BC Managed Care – PPO | Admitting: Family Medicine

## 2016-04-10 VITALS — BP 140/92 | HR 85 | Temp 98.4°F | Resp 17 | Ht 72.0 in | Wt 190.0 lb

## 2016-04-10 DIAGNOSIS — J0111 Acute recurrent frontal sinusitis: Secondary | ICD-10-CM | POA: Diagnosis not present

## 2016-04-10 DIAGNOSIS — L509 Urticaria, unspecified: Secondary | ICD-10-CM | POA: Diagnosis not present

## 2016-04-10 DIAGNOSIS — Z889 Allergy status to unspecified drugs, medicaments and biological substances status: Secondary | ICD-10-CM

## 2016-04-10 MED ORDER — RANITIDINE HCL 150 MG PO TABS: 150.0000 mg | ORAL_TABLET | Freq: Two times a day (BID) | ORAL | 0 refills | Status: DC

## 2016-04-10 MED ORDER — CETIRIZINE HCL 10 MG PO TABS: 10.0000 mg | ORAL_TABLET | Freq: Every day | ORAL | 0 refills | Status: DC

## 2016-04-10 MED ORDER — DIPHENHYDRAMINE HCL 50 MG/ML IJ SOLN
50.0000 mg | Freq: Once | INTRAMUSCULAR | Status: AC
  Administered 2016-04-10: 50 mg via INTRAMUSCULAR

## 2016-04-10 NOTE — Progress Notes (Signed)
Chief Complaint  Patient presents with  . Allergic Reaction    prednisone. Rash. Started this morning.     HPI  Pt was treated with a steroid taper for respiratory infection for recurrent sinusitis which has resolved.  Reports that he took his last prednisone yesterday. He states that yesterday she started feeling flushed And his skin felt like sunburn This morning at 8:15 he developed a rash on his forehead that was red and itchy It has been spreading from his face to his neck to his torso and arms. He took advil but that did not help He took a small benadryl at 8:15am. No difficulty breathing No wheezing No sob No blisters This has never happened before and his has not history of allergies to medications   Past Medical History:  Diagnosis Date  . Insomnia 2015  . S/P LASIK surgery   . Seasonal allergies     Current Outpatient Prescriptions  Medication Sig Dispense Refill  . moxifloxacin (AVELOX) 400 MG tablet Take 1 tablet (400 mg total) by mouth daily. 10 tablet 0  . predniSONE (DELTASONE) 20 MG tablet Take 3 PO QAM x3days, 2 PO QAM x3days, 1 PO QAM x3days 18 tablet 0  . cetirizine (ZYRTEC) 10 MG tablet Take 1 tablet (10 mg total) by mouth daily. 30 tablet 0  . HYDROcodone-homatropine (HYCODAN) 5-1.5 MG/5ML syrup 2.5 to 5ml by mouth a bedtime as needed for cough. (Patient not taking: Reported on 04/10/2016) 120 mL 0  . ranitidine (ZANTAC) 150 MG tablet Take 1 tablet (150 mg total) by mouth 2 (two) times daily. 30 tablet 0   Current Facility-Administered Medications  Medication Dose Route Frequency Provider Last Rate Last Dose  . diphenhydrAMINE (BENADRYL) injection 50 mg  50 mg Intramuscular Once Doristine BosworthZoe A Jasenia Weilbacher, MD        Allergies:  Allergies  Allergen Reactions  . Prednisone Hives    Past Surgical History:  Procedure Laterality Date  . LASIK  2006    Social History   Social History  . Marital status: Married    Spouse name: N/A  . Number of children:  N/A  . Years of education: N/A   Occupational History  . principal [employer]   Social History Main Topics  . Smoking status: Former Smoker    Packs/day: 1.00    Years: 5.00    Quit date: 10/10/2010  . Smokeless tobacco: Never Used  . Alcohol use 3.6 oz/week    6 Cans of beer per week     Comment: 6 drinks per week (weekends)  . Drug use: No  . Sexual activity: Yes    Birth control/ protection: None   Other Topics Concern  . None   Social History Narrative   From [place]. 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  Ranae PlumberFinn 7yo, [name] 44yo.  Exercise 4 days per week, walking , running, weights.    ROS  Objective: Vitals:   04/10/16 1135  BP: (!) 140/92  Pulse: 85  Resp: 17  Temp: 98.4 F (36.9 C)  TempSrc: Oral  SpO2: 98%  Weight: 190 lb (86.2 kg)  Height: 6' (1.829 m)    Physical Exam General: alert, oriented, in NAD Head: normocephalic, atraumatic, no sinus tenderness Eyes: EOM intact, no scleral icterus or conjunctival injection Ears: TM clear bilaterally Throat: no pharyngeal exudate or erythema Lymph: no posterior auricular, submental or cervical lymph adenopathy Heart: normal rate, normal sinus rhythm, no murmurs Lungs: clear to auscultation  bilaterally, no wheezing Skin: erythematous rash on face, ears, neck and sparingly on shoulders and back Spreading down torso Legs spared Rash is papular No vesicles  Assessment and Plan Earvin HansenGerald was seen today for allergic reaction.  Diagnoses and all orders for this visit: Hives Drug allergy-  -     diphenhydrAMINE (BENADRYL) injection 50 mg; Inject 1 mL (50 mg total) into the muscle once. 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 -      ranitidine (ZANTAC) 150 MG tablet; Take 1 tablet (150 mg total) by mouth 2 (two) times daily.  Acute frontal sinusitis- resolved with prednisone    Brennden Masten A Creta LevinStallings

## 2016-04-10 NOTE — Patient Instructions (Addendum)
     IF you received an x-ray today, you will receive an invoice from [facility]. Please contact [facility] at [phone] with questions or concerns regarding your invoice.   IF you received labwork today, you will receive an invoice from [facility]. Please contact [facility] at [phone] with questions or concerns regarding your invoice.   Our billing staff will not be able to assist you with questions regarding bills from these companies.  You will be contacted with the lab results as soon as they are available. The fastest way to get your results is to activate your My Chart account. Instructions are located on the last page of this paperwork. If you have not heard from us regarding the results in 2 weeks, please contact this office.      Hives Hives are itchy, red, swollen areas of the skin. They can vary in size and location on your body. Hives can come and go for hours or several days (acute hives) or for several weeks (chronic hives). Hives do not spread from person to person (noncontagious). They may get worse with scratching, exercise, and emotional stress. CAUSES   Allergic reaction to food, additives, or drugs.  Infections, including the common cold.  Illness, such as vasculitis, lupus, or thyroid disease.  Exposure to sunlight, heat, or cold.  Exercise.  Stress.  Contact with chemicals. SYMPTOMS   Red or white swollen patches on the skin. The patches may change size, shape, and location quickly and repeatedly.  Itching.  Swelling of the hands, feet, and face. This may occur if hives develop deeper in the skin. DIAGNOSIS  Your caregiver can usually tell what is wrong by performing a physical exam. Skin or blood tests may also be done to determine the cause of your hives. In some cases, the cause cannot be determined. TREATMENT  Mild cases usually get better with medicines such as antihistamines. Severe cases may  require an emergency epinephrine injection. If the cause of your hives is known, treatment includes avoiding that trigger.  HOME CARE INSTRUCTIONS   Avoid causes that trigger your hives.  Take antihistamines as directed by your caregiver to reduce the severity of your hives. Non-sedating or low-sedating antihistamines are usually recommended. Do not drive while taking an antihistamine.  Take any other medicines prescribed for itching as directed by your caregiver.  Wear loose-fitting clothing.  Keep all follow-up appointments as directed by your caregiver. SEEK MEDICAL CARE IF:   You have persistent or severe itching that is not relieved with medicine.  You have painful or swollen joints. SEEK IMMEDIATE MEDICAL CARE IF:   You have a fever.  Your tongue or lips are swollen.  You have trouble breathing or swallowing.  You feel tightness in the throat or chest.  You have abdominal pain. These problems may be the first sign of a life-threatening allergic reaction. Call your local emergency services (911 in [place] MAKE SURE YOU:   Understand these instructions.  Will watch your condition.  Will get help right away if you are not doing well or get worse.   This information is not intended to replace advice given to you by your health care provider. Make sure you discuss any questions you have with your health care provider.   Document Released: 05/21/2005 Document Revised: 05/26/2013 Document Reviewed: 08/14/2011 Elsevier Interactive Patient Education 2016 [employer] d

## 2016-08-06 ENCOUNTER — Other Ambulatory Visit: Payer: Self-pay | Admitting: Medical

## 2016-08-07 NOTE — Telephone Encounter (Signed)
Pt has CPX 08/29/16

## 2016-08-07 NOTE — Telephone Encounter (Signed)
Refill and scheduled CPX appt

## 2016-08-07 NOTE — Telephone Encounter (Signed)
Is this okay to refill? 

## 2016-08-29 ENCOUNTER — Encounter: Payer: Self-pay | Admitting: Medical

## 2016-08-29 ENCOUNTER — Ambulatory Visit (INDEPENDENT_AMBULATORY_CARE_PROVIDER_SITE_OTHER): Payer: BC Managed Care – PPO | Admitting: Medical

## 2016-08-29 ENCOUNTER — Telehealth: Payer: Self-pay | Admitting: Family Medicine

## 2016-08-29 VITALS — BP 130/74 | HR 71 | Ht 72.0 in | Wt 192.6 lb

## 2016-08-29 DIAGNOSIS — I8390 Asymptomatic varicose veins of unspecified lower extremity: Secondary | ICD-10-CM | POA: Diagnosis not present

## 2016-08-29 DIAGNOSIS — I8391 Asymptomatic varicose veins of right lower extremity: Secondary | ICD-10-CM | POA: Insufficient documentation

## 2016-08-29 DIAGNOSIS — Z8249 Family history of ischemic heart disease and other diseases of the circulatory system: Secondary | ICD-10-CM | POA: Diagnosis not present

## 2016-08-29 DIAGNOSIS — J301 Allergic rhinitis due to pollen: Secondary | ICD-10-CM | POA: Diagnosis not present

## 2016-08-29 DIAGNOSIS — Z0000 Encounter for general adult medical examination without abnormal findings: Secondary | ICD-10-CM | POA: Diagnosis not present

## 2016-08-29 DIAGNOSIS — G4700 Insomnia, unspecified: Secondary | ICD-10-CM

## 2016-08-29 DIAGNOSIS — E785 Hyperlipidemia, unspecified: Secondary | ICD-10-CM | POA: Insufficient documentation

## 2016-08-29 LAB — CBC
HCT: 42.8 % (ref 38.5–50.0)
HEMOGLOBIN: 14.5 g/dL (ref 13.2–17.1)
MCH: 30.2 pg (ref 27.0–33.0)
MCHC: 33.9 g/dL (ref 32.0–36.0)
MCV: 89.2 fL (ref 80.0–100.0)
MPV: 8.8 fL (ref 7.5–12.5)
Platelets: 349 10*3/uL (ref 140–400)
RBC: 4.8 MIL/uL (ref 4.20–5.80)
RDW: 13.4 % (ref 11.0–15.0)
WBC: 5.5 10*3/uL (ref 4.0–10.5)

## 2016-08-29 LAB — POCT URINALYSIS DIPSTICK
BILIRUBIN UA: NEGATIVE
Blood, UA: NEGATIVE
GLUCOSE UA: NEGATIVE
Ketones, UA: NEGATIVE
LEUKOCYTES UA: NEGATIVE
Nitrite, UA: NEGATIVE
PROTEIN UA: NEGATIVE
Spec Grav, UA: 1.015 (ref 1.030–1.035)
Urobilinogen, UA: NEGATIVE (ref ?–2.0)
pH, UA: 6 (ref 5.0–8.0)

## 2016-08-29 LAB — COMPREHENSIVE METABOLIC PANEL
ALBUMIN: 4.5 g/dL (ref 3.6–5.1)
ALT: 26 U/L (ref 9–46)
AST: 25 U/L (ref 10–40)
Alkaline Phosphatase: 47 U/L (ref 40–115)
BUN: 12 mg/dL (ref 7–25)
CO2: 29 mmol/L (ref 20–31)
Calcium: 10.2 mg/dL (ref 8.6–10.3)
Chloride: 102 mmol/L (ref 98–110)
Creat: 1.2 mg/dL (ref 0.60–1.35)
Glucose, Bld: 106 mg/dL — ABNORMAL HIGH (ref 65–99)
Potassium: 4.5 mmol/L (ref 3.5–5.3)
Sodium: 139 mmol/L (ref 135–146)
TOTAL PROTEIN: 7.1 g/dL (ref 6.1–8.1)
Total Bilirubin: 0.7 mg/dL (ref 0.2–1.2)

## 2016-08-29 LAB — LIPID PANEL
CHOLESTEROL: 219 mg/dL — AB (ref <200)
HDL: 78 mg/dL (ref >40)
LDL Cholesterol: 117 mg/dL — ABNORMAL HIGH (ref <100)
TRIGLYCERIDES: 122 mg/dL (ref <150)
Total CHOL/HDL Ratio: 2.8 Ratio (ref <5.0)
VLDL: 24 mg/dL (ref <30)

## 2016-08-29 MED ORDER — TRAZODONE HCL 50 MG PO TABS: ORAL_TABLET | ORAL | 3 refills | Status: DC

## 2016-08-29 NOTE — Progress Notes (Addendum)
Subjective:   HPI  Manuel Johnson is a 46 y.o. male who presents for physical Chief Complaint  Patient presents with  . Annual Exam    physical     Medical care team includes: Carollee Herter, MD here for primary care Dentist Eye doctor  Concerns: Has been using some apple cider vinegar for health but gave him bad GERD.  He stopped this  Still has issues with insomnia.  No prior sleep study.  Reviewed their medical, surgical, family, social, medication, and allergy history and updated chart as appropriate.   Past Medical History:  Diagnosis Date  . Insomnia 2015  . S/P LASIK surgery   . Seasonal allergies     Past Surgical History:  Procedure Laterality Date  . LASIK  2006    Social History   Social History  . Marital status: Married    Spouse name: N/A  . Number of children: N/A  . Years of education: N/A   Occupational History  . principal [employer]   Social History Main Topics  . Smoking status: Former Smoker    Packs/day: 1.00    Years: 5.00    Quit date: 10/10/2010  . Smokeless tobacco: Never Used  . Alcohol use 3.6 oz/week    6 Cans of beer per week     Comment: 6 drinks per week (weekends)  . Drug use: No  . Sexual activity: Yes    Birth control/ protection: None   Other Topics Concern  . Not on file   Social History Narrative   From [place]. 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  Manuel Johnson, Manuel Johnson 46yo.  Exercise daily, 6mi daily with fit bit, running, weights.  08/2016    Family History  Problem Relation Age of Onset  . Alzheimer's disease Mother   . Heart disease Mother     angina  . Other Mother     died at 68yo from MVA  . Alcohol abuse Father   . Cancer Neg Hx   . Stroke Neg Hx   . Hypertension Neg Hx   . Diabetes Neg Hx      Current Outpatient Prescriptions:  .  traZODone (DESYREL) 50 MG tablet, TAKE 1/2-1 TABLETS (25-50 MG TOTAL) BY MOUTH AT BEDTIME AS NEEDED FOR SLEEP., Disp: 90  tablet, Rfl: 3  Allergies  Allergen Reactions  . Prednisone Hives     Review of Systems Constitutional: -fever, -chills, -sweats, -unexpected weight change, -decreased appetite, -fatigue Allergy: -sneezing, -itching, -congestion Dermatology: -changing moles, --rash, -lumps ENT: -runny nose, -ear pain, -sore throat, -hoarseness, -sinus pain, -teeth pain, - ringing in ears, -hearing loss, -nosebleeds Cardiology: -chest pain, -palpitations, -swelling, -difficulty breathing when lying flat, -waking up short of breath Respiratory: -cough, -shortness of breath, -difficulty breathing with exercise Johnson exertion, -wheezing, -coughing up blood Gastroenterology: -abdominal pain, -nausea, -vomiting, -diarrhea, -constipation, -blood in stool, -changes in bowel movement, -difficulty swallowing Johnson eating Hematology: -bleeding, -bruising  Musculoskeletal: -joint aches, -muscle aches, -joint swelling, -back pain, -neck pain, -cramping, -changes in gait Ophthalmology: denies vision changes, eye redness, itching, discharge Urology: -burning with urination, -difficulty urinating, -blood in urine, -urinary frequency, -urgency, -incontinence Neurology: -headache, -weakness, -tingling, -numbness, -memory loss, -falls, -dizziness Psychology: -depressed mood, -agitation, +sleep problems     Objective: BP 130/74   Pulse 71   Ht 6' (1.829 m)   Wt 192 lb 9.6 oz (87.4 kg)   SpO2 97%   BMI 26.12 kg/m   General appearance: alert, no  distress, WD/WN, Caucasian male Skin: scattered macules, few seborrheic keratosis of the back, left upper, left lower, right upper, no worrisome lesions HEENT: normocephalic, conjunctiva/corneas normal, sclerae anicteric, PERRLA, EOMi, nares patent, no discharge Johnson erythema, pharynx normal Oral cavity: MMM, tongue normal, teeth normal Neck: supple, no lymphadenopathy, no thyromegaly, no masses, normal ROM, no bruits Chest: non tender, normal shape and expansion Heart: RRR, normal  S1, S2, no murmurs Lungs: CTA bilaterally, no wheezes, rhonchi, Johnson rales Abdomen: +bs, soft, non tender, non distended, no masses, no hepatomegaly, no splenomegaly, no bruits Back: non tender, normal ROM, no scoliosis Musculoskeletal: upper extremities non tender, no obvious deformity, normal ROM throughout, lower extremities non tender, no obvious deformity, normal ROM throughout Extremities: moderate varicose veins right lower leg, otherwise no edema, no cyanosis, no clubbing Pulses: 2+ symmetric, upper and lower extremities, normal cap refill Neurological: alert, oriented x 3, CN2-12 intact, strength normal upper extremities and lower extremities, sensation normal throughout, DTRs 2+ throughout, no cerebellar signs, gait normal Psychiatric: normal affect, behavior normal, pleasant  GU: normal male external genitalia,uncircumcised, non tender, no masses, no hernia, no lymphadenopathy Rectal: deferred   Adult ECG Report  Indication: physical, family history of heart disease  Rate: 68 bpm  Rhythm: normal sinus rhythm  QRS Axis: 65 degrees  PR Interval:  QRS Duration: 86ms  QTc:  Conduction Disturbances: incomplete RBBB  Other Abnormalities: nonspecific T wave inversion V1  Patient's cardiac risk factors are: male gender and family history of heart disease.  EKG comparison: none  Narrative Interpretation: incomplete RBBB    Assessment and Plan :    Encounter Diagnoses  Name Primary?  . Routine general medical examination at a health care facility Yes  . Elevated lipids   . Family history of heart disease   . Insomnia, unspecified type   . Chronic seasonal allergic rhinitis due to pollen   . Varicose vein of leg     Physical exam - discussed and counseled on healthy lifestyle, diet, exercise, preventative care, vaccinations, sick and well care, proper use of emergency dept and after hours care, and addressed their concerns.    Health screening: See your eye doctor  yearly for routine vision care. See your dentist yearly for routine dental care including hygiene visits twice yearly.  Cancer screening  Discussed colonoscopy screening age 24yo unless higher risk for earlier screening Discussed PSA, prostate exam, and prostate cancer screening risks/benefits.   Discussed prostate symptoms as well.  Prostate screening performed: No  Vaccinations: Counseled on the following vaccines:  influenza  Acute issues discussed: none  Separate significant chronic issues discussed: Insomnia - chronic, doing fine on current medication Varicose veins - Advised c/t exercise, OTC compression hose for varicosities right leg.    Declines vein clinic referral  Traylon was seen today for annual exam.  Diagnoses and all orders for this visit:  Routine general medical examination at a health care facility -     Urinalysis Dipstick -     Comprehensive metabolic panel -     CBC -     Lipid panel -     Hemoglobin A1c -     EKG 12-Lead  Elevated lipids -     Lipid panel -     EKG 12-Lead  Family history of heart disease -     EKG 12-Lead  Insomnia, unspecified type  Chronic seasonal allergic rhinitis due to pollen  Varicose vein of leg  Other orders -  traZODone (DESYREL) 50 MG tablet; TAKE 1/2-1 TABLETS (25-50 MG TOTAL) BY MOUTH AT BEDTIME AS NEEDED FOR SLEEP.    Follow-up pending labs, yearly for physical

## 2016-08-29 NOTE — Telephone Encounter (Signed)
[facility] to ask a few more question. He would not give details

## 2016-08-30 LAB — HEMOGLOBIN A1C
Hgb A1c MFr Bld: 5.3 % (ref <5.7)
MEAN PLASMA GLUCOSE: 105 mg/dL

## 2016-08-30 NOTE — Telephone Encounter (Signed)
EKG reviewed.  There was an abnormal finding in one of the leads that doesn't suggest any kind of ischemia or risk.  If he is not having any chest pain, shortness of breath, or cardiac symptoms, then there isn't any kind of urgency to see a cardiologist.  Looks like there are not prior EKG's for comparisons.  If it is something that is hereditary, there is no need for a fix.  I recommend that he can further discuss with Vincenza HewsShane when he returns (no urgency), to see if pt needs any kind of referral, more for reassurance/pacify, not that one is truly needed.  If asymtomatic (which he was), these questions concerns can wait for his PCP to address whether any consultation is needed (none is needed acutely).

## 2016-08-30 NOTE — Telephone Encounter (Signed)
Patient advised. He will call back when Vincenza HewsShane returns for possible appt to discuss with him and see what he thinks.

## 2016-08-30 NOTE — Telephone Encounter (Signed)
Pt came in yesterday for physical and Vincenza HewsShane order an EKG on him he was told that had T wave inversion. Vincenza HewsShane explained to him that this is something that he could possible been  born with or develop over time. Vincenza HewsShane let the pt know that he didn't know if he was born with or not because he has never had an EKG done in the past. Pt is very worried about this. Has a lot question in regards this. He wants if this can be fixed or if he needs to follow up with an cardiologist.

## 2016-09-14 ENCOUNTER — Other Ambulatory Visit: Payer: Self-pay

## 2016-09-14 DIAGNOSIS — Z8249 Family history of ischemic heart disease and other diseases of the circulatory system: Secondary | ICD-10-CM

## 2016-09-20 ENCOUNTER — Ambulatory Visit (INDEPENDENT_AMBULATORY_CARE_PROVIDER_SITE_OTHER): Payer: BC Managed Care – PPO | Admitting: Cardiology

## 2016-09-20 ENCOUNTER — Encounter: Payer: Self-pay | Admitting: Cardiology

## 2016-09-20 VITALS — BP 137/80 | HR 78 | Ht 72.0 in | Wt 195.0 lb

## 2016-09-20 DIAGNOSIS — R002 Palpitations: Secondary | ICD-10-CM | POA: Diagnosis not present

## 2016-09-20 DIAGNOSIS — R0789 Other chest pain: Secondary | ICD-10-CM

## 2016-09-20 DIAGNOSIS — Z8249 Family history of ischemic heart disease and other diseases of the circulatory system: Secondary | ICD-10-CM | POA: Diagnosis not present

## 2016-09-20 DIAGNOSIS — I8390 Asymptomatic varicose veins of unspecified lower extremity: Secondary | ICD-10-CM | POA: Diagnosis not present

## 2016-09-20 NOTE — Patient Instructions (Signed)
Schedule [address] Your physician has requested that you have an exercise tolerance test. For further information please visit [email or web address] Please also follow instruction sheet, as given.    FREE Purchase  [facility]cor - [facility] (  APP STORE) MAY NEED TO PURCHASE A RECORDING DEVICE.    Your physician recommends that you schedule a follow-up appointment in 1 MONTH WITH DR HARDING.   If you need a refill on your cardiac medications before your next appointment, please call your pharmacy.

## 2016-09-20 NOTE — Progress Notes (Signed)
PCP: Carollee Herter, MD; Crosby Oyster, PA  Clinic Note: Chief Complaint  Patient presents with  . New Patient (Initial Visit)    Consult for palpitations and chest pain. Currently no symptoms    HPI: Manuel Johnson is a 46 y.o. male who is being seen today for the evaluation of palpitations & chest pain at the request of Jac Canavan, PA-C.There was concern for some abnormalities on his EKG and a family history of CAD.   Manuel Johnson is a 46 y.o. 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 He has been otherwise healthy. He states his mother had "angina", but never was evaluated by cardiology.  Manuel Johnson was last seen on March 28 by Mr. Tysinger for a routine annual physical.    Recent Hospitalizations: n/a  Studies Reviewed: no cardiology studies  Interval History: "Manuel Johnson" presents for cardiology evaluation today actually without any true symptoms ongoing. He tells me that his mother had angina, but by the chart it looks like his father also had some coronary disease issues. He himself has never had any issues with high blood pressure cholesterol. He is relatively healthy and very active. He is an avid "football" (soccer) Manuel Johnson and enjoys going to watch gains with his friends and has a few beers while watching the game. When he has noted over the last couple months is that he was having significant palpitations the next day short little bursts of rapid heartbeats that were sporadic in nature. They would not last very long maybe a couple seconds to maybe a minute at most. Nothing that made him feel lightheaded or dizzy, just startled him. No other symptoms associated with it.  Additionally, he is noted some central chest high epigastric dull pressure as if someone is pushing with a finger been persistent for several hours at a time. This is not at all associated  with anything in particular and is not necessarily made worse with exertion. It has been ongoing now for a couple days and has a mildly concerned. They will linger for several hours and then go away. Maybe a shortness 10-15 minutes and then as well as couple hours. Not currently having it when he first came in, but as we talked he did start noticing a little bit. It was not reproducible.  He is fairly active walking around the school at work, and has not had any exacerbation of this symptom No heart failure symptoms of PND, orthopnea or edema. No syncope or near-syncope, TIA/amaurosis fugax. No lightheadedness or dizziness associated with his palpitations.  No claudication.  ROS: A comprehensive was performed. Review of Systems  Constitutional: Negative for malaise/fatigue.  HENT: Positive for congestion.   Respiratory: Positive for cough (When his allergies are bad).   Gastrointestinal: Positive for abdominal pain, blood in stool and melena.  Genitourinary: Negative for hematuria.  Musculoskeletal: Negative.   Neurological: Negative.   Endo/Heme/Allergies: Positive for environmental allergies.  Psychiatric/Behavioral: Negative.   All other systems reviewed and are negative.   Past Medical History:  Diagnosis Date  . Insomnia 2015  . S/P LASIK surgery   . Seasonal allergies     Past Surgical History:  Procedure Laterality Date  . LASIK  2006    Current Meds  Medication Sig  . traZODone (DESYREL) 50 MG tablet TAKE 1/2-1 TABLETS (25-50 MG TOTAL) BY MOUTH AT BEDTIME AS NEEDED FOR SLEEP.    Allergies  Allergen Reactions  . Prednisone Hives    Social History   Social History  . Marital status: Married    Spouse name: N/A  . Number of children: N/A  . Years of education: N/A   Occupational History  . principal [employer]    [employer]   Social History Main Topics  . Smoking status: Former Smoker    Packs/day: 1.00    Years: 5.00    Quit date:  10/10/2010  . Smokeless tobacco: Never Used  . Alcohol use 3.6 oz/week    6 Cans of beer per week     Comment: 6 drinks per week (weekends)  . Drug use: No  . Sexual activity: Yes    Birth control/ protection: None   Other Topics Concern  . None   Social History Narrative   [place] [facility]).  Grew up in [place], [place] (Is a [employer] supporter). 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  [name], [name] 6yo.  Exercise daily, 6mi daily with fit bit, running, weights.  08/2016   He enjoys having a few pints of beer on the weekends with friends while watching soccer matches.  He does not drink much or often.    family history includes Alcohol abuse in his father; Alzheimer's disease in his mother; Heart disease in his mother; Other in his mother.  Wt Readings from Last 3 Encounters:  09/20/16 195 lb (88.5 kg)  08/29/16 192 lb 9.6 oz (87.4 kg)  04/10/16 190 lb (86.2 kg)    PHYSICAL EXAM BP 137/80   Pulse 78   Ht 6' (1.829 m)   Wt 195 lb (88.5 kg)   BMI 26.45 kg/m  General appearance: alert, cooperative, appears stated age, no distress and Well-nourished, well-groomed. He speaks with a classic [place] accent. Very pleasant mood and affect HEENT: [place]/AT, EOMI, MMM, anicteric sclera Neck: no adenopathy, no carotid bruit and no JVD Lungs: clear to auscultation bilaterally, normal percussion bilaterally and non-labored Heart: regular rate and rhythm, S1& S2 normal, no murmur, click, rub or gallop; nondisplaced PMI Abdomen: soft, non-tender; bowel sounds normal; no masses,  no organomegaly; no HJR Extremities: extremities normal, atraumatic, no cyanosis, oredema  Pulses: 2+ and symmetric;  Skin: mobility and turgor normal, no edema, no evidence of bleeding or bruising, no lesions noted, temperature normal and texture normal -- he does have mild varicosities in his lower extremities that are slightly swollen. No tenderness or pain. He says this is something  inherited from his mother Neurologic: Mental status: Alert, oriented, thought content appropriate Cranial nerves: normal (II-XII grossly intact)    Adult ECG Report -Not checked --EKG from PCP visit showed sinus rhythm with essentially normal EKG but RSR prime in precordial leads suggestive of RV conduction delay.  Other studies Reviewed: Additional studies/ records that were reviewed today include:  Recent Labs:   Lab Results  Component Value Date   CHOL 219 (H) 08/29/2016   HDL 78 08/29/2016   LDLCALC 117 (H) 08/29/2016   TRIG 122 08/29/2016   CHOLHDL 2.8 08/29/2016   Lab Results  Component Value Date   CREATININE 1.20 08/29/2016   BUN 12 08/29/2016   NA 139 08/29/2016   K 4.5 08/29/2016   CL 102 08/29/2016   CO2 29 08/29/2016    ASSESSMENT / PLAN: Problem List Items Addressed This Visit    Chest discomfort - Primary    The chest discomfort is somewhat itching. The location is  any particular activity and is prolonged. That would make it less likely cardiac. His major risk factor is that he is a former smoker and he has a family history of CAD although nothing significant as far as I can tell. We talked about different options, and I think starting with a GXT (Exercise Tolerance Test) is the best starting platform. His EKG shows include right bundle branch block which is not indicative of anything significant.  No abnormal findings on exam to warrant an echocardiogram or imaging study at this time. The other indication for doing a treadmill be to see if he has any exacerbation of his palpitations.      Relevant Orders   EXERCISE TOLERANCE TEST   Family history of heart disease    With his family history, he has quit smoking, and has become quite active. Did continue to recommend exercise and watching his diet. His lipid panel is okay however may want to consider targeting an LDL less than 100 on as he gets older. We'll evaluate baseline screening test with a GXT/ETT        Palpitations    What is describing sounds a lot like frequent PACs or PVCs in bursts. They come and go, he seemed to be triggered by alcohol either the alcohol itself or associated dehydration. Talk about the importance of staying hydrated, and avoiding excess caffeine, sugars and even potentially cutting back the alcohol. Had any palpitations in the last month or so, because he himself made the connection between alcohol and the palpitations. Ex line since he is not having any current palpitations, I don't think a monitor is warranted. However we did talk to him about purchasing an iPhone application that will help him monitor rhythms or arrhythmias when they occur. He can then show them to me when he comes back for follow-up.  He seemed very soon this plan because of allow him to have monitoring all the time and not necessarily limited to 48 hours for 1 week or one month. He also didn't like the idea of having to wear a monitor.      Relevant Orders   EXERCISE TOLERANCE TEST   Varicose vein of leg    Not overly symptomatic. I did recommend support stockings to prevent exacerbation. I also talked about foot elevation.         Current medicines are reviewed at length with the patient today. (+/- concerns) n/a The following changes have been made: n/a  Patient Instructions  Schedule [address] Your physician has requested that you have an exercise tolerance test. For further information please visit [email or web address] Please also follow instruction sheet, as given.    FREE Purchase  [employer] - [facility] (  APP STORE) MAY NEED TO PURCHASE A RECORDING DEVICE.    Your physician recommends that you schedule a follow-up appointment in 1 MONTH WITH DR Dink Creps.   If you need a refill on your cardiac medications before your next appointment, please call your pharmacy.      Studies Ordered:   Orders Placed This Encounter  Procedures  . EXERCISE TOLERANCE TEST       Bryan Lemma, M.D., M.S. Interventional Cardiologist   Pager # [phone] Phone # [phone] [address] [place], [place] [address]

## 2016-09-21 ENCOUNTER — Telehealth (HOSPITAL_COMMUNITY): Payer: Self-pay

## 2016-09-21 NOTE — Telephone Encounter (Signed)
Encounter complete. 

## 2016-09-22 ENCOUNTER — Encounter: Payer: Self-pay | Admitting: Cardiology

## 2016-09-22 DIAGNOSIS — R0789 Other chest pain: Secondary | ICD-10-CM | POA: Insufficient documentation

## 2016-09-22 DIAGNOSIS — R002 Palpitations: Secondary | ICD-10-CM | POA: Insufficient documentation

## 2016-09-22 NOTE — Assessment & Plan Note (Signed)
What is describing sounds a lot like frequent PACs or PVCs in bursts. They come and go, he seemed to be triggered by alcohol either the alcohol itself or associated dehydration. Talk about the importance of staying hydrated, and avoiding excess caffeine, sugars and even potentially cutting back the alcohol. Had any palpitations in the last month or so, because he himself made the connection between alcohol and the palpitations. Ex line since he is not having any current palpitations, I don't think a monitor is warranted. However we did talk to him about purchasing an iPhone application that will help him monitor rhythms or arrhythmias when they occur. He can then show them to me when he comes back for follow-up.  He seemed very soon this plan because of allow him to have monitoring all the time and not necessarily limited to 48 hours for 1 week or one month. He also didn't like the idea of having to wear a monitor.

## 2016-09-22 NOTE — Assessment & Plan Note (Signed)
Not overly symptomatic. I did recommend support stockings to prevent exacerbation. I also talked about foot elevation.

## 2016-09-22 NOTE — Assessment & Plan Note (Signed)
With his family history, he has quit smoking, and has become quite active. Did continue to recommend exercise and watching his diet. His lipid panel is okay however may want to consider targeting an LDL less than 100 on as he gets older. We'll evaluate baseline screening test with a GXT/ETT

## 2016-09-22 NOTE — Assessment & Plan Note (Signed)
The chest discomfort is somewhat itching. The location is any particular activity and is prolonged. That would make it less likely cardiac. His major risk factor is that he is a former smoker and he has a family history of CAD although nothing significant as far as I can tell. We talked about different options, and I think starting with a GXT (Exercise Tolerance Test) is the best starting platform. His EKG shows include right bundle branch block which is not indicative of anything significant.  No abnormal findings on exam to warrant an echocardiogram or imaging study at this time. The other indication for doing a treadmill be to see if he has any exacerbation of his palpitations.

## 2016-09-25 ENCOUNTER — Ambulatory Visit (HOSPITAL_COMMUNITY)
Admission: RE | Admit: 2016-09-25 | Discharge: 2016-09-25 | Disposition: A | Discharge status: Home / Self Care | Payer: BC Managed Care – PPO | Source: Ambulatory Visit | Attending: Cardiovascular Disease | Admitting: Cardiovascular Disease

## 2016-09-25 DIAGNOSIS — R0789 Other chest pain: Secondary | ICD-10-CM | POA: Diagnosis not present

## 2016-09-25 DIAGNOSIS — R002 Palpitations: Secondary | ICD-10-CM | POA: Insufficient documentation

## 2016-09-25 LAB — EXERCISE TOLERANCE TEST
CHL RATE OF PERCEIVED EXERTION: 18
CSEPED: 10 min
CSEPEDS: 29 s
Estimated workload: 12.5 METS
MPHR: 175 {beats}/min
Peak HR: 162 {beats}/min
Percent HR: 92 %
Rest HR: 76 {beats}/min

## 2016-10-22 ENCOUNTER — Encounter: Payer: Self-pay | Admitting: Cardiology

## 2016-10-22 ENCOUNTER — Ambulatory Visit (INDEPENDENT_AMBULATORY_CARE_PROVIDER_SITE_OTHER): Payer: BC Managed Care – PPO | Admitting: Cardiology

## 2016-10-22 VITALS — BP 140/92 | HR 84 | Ht 72.0 in | Wt 197.0 lb

## 2016-10-22 DIAGNOSIS — Z8249 Family history of ischemic heart disease and other diseases of the circulatory system: Secondary | ICD-10-CM

## 2016-10-22 DIAGNOSIS — R0789 Other chest pain: Secondary | ICD-10-CM | POA: Diagnosis not present

## 2016-10-22 DIAGNOSIS — R002 Palpitations: Secondary | ICD-10-CM | POA: Diagnosis not present

## 2016-10-22 NOTE — Progress Notes (Signed)
PCP: Ronnald NianLalonde, [name] C, MD  Clinic Note: No chief complaint on file.   HPI: Manuel Johnson is a 46 y.o. male who is being seen today for follow-up evaluation of palpitations and atypical chest pain at the request of Crosby Oysteravid Tysinger, [place] Ronnald NianLalonde, [name] C, MD  Manuel MiyamotoGerald Johnson [name]) was last seen on April 19 for evaluation of palpitations and some chest discomfort. He was evaluated with a exercise stress test.  Recent Hospitalizations: n/a  Studies Personally Reviewed - (if available, images/films reviewed: From Epic Chart or Care Everywhere)  ETT 09/25/2016: ETT with good exercise tolerance (10:29); no chest pain; normal BP response; no ST changes (upsloping ST depression with peak exertion felt to be nondiagnostic); negative adequate ETT; Duke treadmill score 10.  Blood pressure demonstrated a normal response to exercise.  There was no ST segment deviation noted during stress.   Interval History: [name] returns to discuss his stress test results. He says that he's been very conscious of hydrating himself and his not noticed nearly theamount of outpatient he was before. No more chest discomfort.  No chest pain or shortness of breath with rest or exertion. No PND, orthopnea or edema.  No palpitations, lightheadedness, dizziness, weakness or syncope/near syncope. No TIA/amaurosis fugax symptoms. No melena, hematochezia, hematuria, or epstaxis. No claudication.  ROS: A comprehensive was performed.  Review of Systems  Constitutional: Negative for malaise/fatigue.  Neurological: Negative for dizziness and loss of consciousness.  Psychiatric/Behavioral: The patient is not nervous/anxious.     I have reviewed and (if needed) personally updated the patient's problem list, medications, allergies, past medical and surgical history, social and family history.   Past Medical History:  Diagnosis Date  . Insomnia 2015  . S/P LASIK surgery   . Seasonal allergies     Past Surgical History:    Procedure Laterality Date  . LASIK  2006    No outpatient prescriptions have been marked as taking for the 10/22/16 encounter (Office Visit) with Marykay LexHarding, Marvene Strohm W, MD.    Allergies  Allergen Reactions  . Prednisone Hives    Social History   Social History  . Marital status: Married    Spouse name: N/A  . Number of children: N/A  . Years of education: N/A   Occupational History  . principal [employer]   Social History Main Topics  . Smoking status: Former Smoker    Packs/day: 1.00    Years: 5.00    Quit date: 10/10/2010  . Smokeless tobacco: Never Used  . Alcohol use 3.6 oz/week    6 Cans of beer per week     Comment: 6 drinks per week (weekends)  . Drug use: No  . Sexual activity: Yes    Birth control/ protection: None   Other Topics Concern  . None   Social History Narrative   [place] [facility]  [place] (Is a [employer] supporter). 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  Barbette OrFinn 8yo, [name] 45yo.  Exercise daily, 6mi daily with fit bit, running, weights.  08/2016   He enjoys having a few pints of beer on the weekends with friends while watching soccer matches.  He does not drink much or often.    family history includes Alcohol abuse in his father; Alzheimer's disease in his mother; Heart disease in his mother; Other in his mother.  Wt Readings from Last 3 Encounters:  10/22/16 197 lb (89.4 kg)  09/20/16 195 lb (88.5 kg)  08/29/16 192 lb 9.6 oz (87.4 kg)    PHYSICAL EXAM BP (!) 140/92   Pulse 84   Ht 6' (1.829 m)   Wt 197 lb (89.4 kg)   SpO2 96%   BMI 26.72 kg/m  General appearance: alert, cooperative, appears stated age, no distress.  HEENT: [place]/AT, EOMI, MMM, anicteric sclera Heart: regular rate and rhythm, S1 &S2 normal, no murmur, click, rub or gallop Neurologic: Mental status: Alert & oriented x 3, thought content appropriate; non-focal exam.  Pleasant mood &  affect.    Adult ECG Report N/a  Other studies Reviewed: Additional studies/ records that were reviewed today include:  Recent Labs:  n/a     ASSESSMENT / PLAN: Problem List Items Addressed This Visit    Chest discomfort - Primary    He did very well on his stress test. No symptoms during his stress test. He felt quite comfortable with the results. He is no longer having these unusual symptoms. Likely noncardiac in nature.      Family history of heart disease   Palpitations    Quite likely had either PACs or PVCs potentially in couplets or triplets, no longer having any symptoms since he is avoiding caffeine and staying hydrated.  None noted during exercise phase of GXT. Thankfully since he did not have any symptoms during this last month, we did not have him wear a monitor.  Continue to monitor. Otherwise return as needed.         Current medicines are reviewed at length with the patient today. (+/- concerns) n/a The following changes have been made: n/a  Patient Instructions  NO CHANGE IN CURRENT TREATMENT OR MEDICATIONS    Your physician wants you to follow-up  ON AN AS NEEDED BASIS.    Studies Ordered:   No orders of the defined types were placed in this encounter.     Bryan Lemma, M.D., M.S. Interventional Cardiologist   Pager # [phone] Phone # [phone] [address] [place], [place] [address]

## 2016-10-22 NOTE — Assessment & Plan Note (Signed)
He did very well on his stress test. No symptoms during his stress test. He felt quite comfortable with the results. He is no longer having these unusual symptoms. Likely noncardiac in nature.

## 2016-10-22 NOTE — Assessment & Plan Note (Signed)
Quite likely had either PACs or PVCs potentially in couplets or triplets, no longer having any symptoms since he is avoiding caffeine and staying hydrated.  None noted during exercise phase of GXT. Thankfully since he did not have any symptoms during this last month, we did not have him wear a monitor.  Continue to monitor. Otherwise return as needed.

## 2016-10-22 NOTE — Patient Instructions (Addendum)
NO CHANGE IN CURRENT TREATMENT OR MEDICATIONS    Your physician wants you to follow-up  ON AN AS NEEDED BASIS.

## 2017-04-19 ENCOUNTER — Ambulatory Visit: Payer: BC Managed Care – PPO | Admitting: Urgent Care

## 2017-04-19 ENCOUNTER — Encounter: Payer: Self-pay | Admitting: Urgent Care

## 2017-04-19 VITALS — BP 122/82 | HR 92 | Temp 98.4°F | Resp 16 | Ht 72.0 in | Wt 192.8 lb

## 2017-04-19 DIAGNOSIS — J018 Other acute sinusitis: Secondary | ICD-10-CM

## 2017-04-19 DIAGNOSIS — R5383 Other fatigue: Secondary | ICD-10-CM | POA: Diagnosis not present

## 2017-04-19 DIAGNOSIS — R059 Cough, unspecified: Secondary | ICD-10-CM

## 2017-04-19 DIAGNOSIS — R05 Cough: Secondary | ICD-10-CM | POA: Diagnosis not present

## 2017-04-19 DIAGNOSIS — H6690 Otitis media, unspecified, unspecified ear: Secondary | ICD-10-CM | POA: Diagnosis not present

## 2017-04-19 MED ORDER — HYDROCOD POLST-CPM POLST ER 10-8 MG/5ML PO SUER: 5.0000 mL | Freq: Every evening | ORAL | 0 refills | Status: DC | PRN

## 2017-04-19 MED ORDER — PSEUDOEPHEDRINE HCL ER 120 MG PO TB12: 120.0000 mg | ORAL_TABLET | Freq: Two times a day (BID) | ORAL | 3 refills | Status: DC

## 2017-04-19 MED ORDER — AMOXICILLIN 875 MG PO TABS: 875.0000 mg | ORAL_TABLET | Freq: Two times a day (BID) | ORAL | 0 refills | Status: DC

## 2017-04-19 MED ORDER — BENZONATATE 100 MG PO CAPS: 100.0000 mg | ORAL_CAPSULE | Freq: Three times a day (TID) | ORAL | 0 refills | Status: DC | PRN

## 2017-04-19 NOTE — Progress Notes (Signed)
  MRN: [id number] DOB: 06/28/1970  Subjective:   Manuel Johnson is a 46 y.o. male presenting for 2 week history of persistent sinus congestion, malaise, sore throat, drainage. He is now having dark phlegm with his cough, right ear pain. Cough elicits chest pain, shob. Denies fever, ear drainage, wheezing, abdominal pain, rashes. Denies smoking cigarettes. Denies history of asthma, allergies.  Manuel Johnson is not currently taking any medications and is allergic to prednisone.  Manuel Johnson  has a past medical history of Insomnia (2015), S/P LASIK surgery, and Seasonal allergies. Also  has a past surgical history that includes LASIK (2006).  Objective:   Vitals: BP 122/82   Pulse 92   Temp 98.4 F (36.9 C) (Oral)   Resp 16   Ht 6' (1.829 m)   Wt 192 lb 12.8 oz (87.5 kg)   SpO2 96%   BMI 26.15 kg/m   Physical Exam  Constitutional: He is oriented to person, place, and time. He appears well-developed and well-nourished.  HENT:  Right TM erythematous and slightly bulging but intact. Nasal turbinates erythematous. Frontal and bilateral maxillary sinus tenderness. Oropharynx with moderate post-nasal drainage, mucous membranes moist.   Eyes: Right eye exhibits no discharge. Left eye exhibits no discharge.  Cardiovascular: Normal rate, regular rhythm and intact distal pulses. Exam reveals no gallop and no friction rub.  No murmur heard. Pulmonary/Chest: Effort normal. No respiratory distress. He has no wheezes. He has no rales.  Neurological: He is alert and oriented to person, place, and time.  Skin: Skin is warm and dry.   Assessment and Plan :   1. Other acute sinusitis, recurrence not specified 2. Acute otitis media, unspecified otitis media type 3. Cough 4. Other fatigue - Will have patient start amoxicillin to address both sinusitis and otitis media. Cough suppression medications offered. Patient is to hydrate well, use Sudafed as needed. Return-to-clinic precautions discussed, patient  verbalized understanding.   Wallis BambergMario Keltie Labell, PA-C Primary Care at [facility] [phone] 04/19/2017  2:07 PM

## 2017-04-19 NOTE — Patient Instructions (Addendum)
Sinusitis, Adult Sinusitis is soreness and inflammation of your sinuses. Sinuses are hollow spaces in the bones around your face. Your sinuses are located:  Around your eyes.  In the middle of your forehead.  Behind your nose.  In your cheekbones.  Your sinuses and nasal passages are lined with a stringy fluid (mucus). Mucus normally drains out of your sinuses. When your nasal tissues become inflamed or swollen, the mucus can become trapped or blocked so air cannot flow through your sinuses. This allows bacteria, viruses, and funguses to grow, which leads to infection. Sinusitis can develop quickly and last for 7?10 days (acute) or for more than 12 weeks (chronic). Sinusitis often develops after a cold. What are the causes? This condition is caused by anything that creates swelling in the sinuses or stops mucus from draining, including:  Allergies.  Asthma.  Bacterial or viral infection.  Abnormally shaped bones between the nasal passages.  Nasal growths that contain mucus (nasal polyps).  Narrow sinus openings.  Pollutants, such as chemicals or irritants in the air.  A foreign object stuck in the nose.  A fungal infection. This is rare.  What increases the risk? The following factors may make you more likely to develop this condition:  Having allergies or asthma.  Having had a recent cold or respiratory tract infection.  Having structural deformities or blockages in your nose or sinuses.  Having a weak immune system.  Doing a lot of swimming or diving.  Overusing nasal sprays.  Smoking.  What are the signs or symptoms? The main symptoms of this condition are pain and a feeling of pressure around the affected sinuses. Other symptoms include:  Upper toothache.  Earache.  Headache.  Bad breath.  Decreased sense of smell and taste.  A cough that may get worse at night.  Fatigue.  Fever.  Thick drainage from your nose. The drainage is often green and  it may contain pus (purulent).  Stuffy nose or congestion.  Postnasal drip. This is when extra mucus collects in the throat or back of the nose.  Swelling and warmth over the affected sinuses.  Sore throat.  Sensitivity to light.  How is this diagnosed? This condition is diagnosed based on symptoms, a medical history, and a physical exam. To find out if your condition is acute or chronic, your health care provider may:  Look in your nose for signs of nasal polyps.  Tap over the affected sinus to check for signs of infection.  View the inside of your sinuses using an imaging device that has a light attached (endoscope).  If your health care provider suspects that you have chronic sinusitis, you may also:  Be tested for allergies.  Have a sample of mucus taken from your nose (nasal culture) and checked for bacteria.  Have a mucus sample examined to see if your sinusitis is related to an allergy.  If your sinusitis does not respond to treatment and it lasts longer than 8 weeks, you may have an MRI or CT scan to check your sinuses. These scans also help to determine how severe your infection is. In rare cases, a bone biopsy may be done to rule out more serious types of fungal sinus disease. How is this treated? Treatment for sinusitis depends on the cause and whether your condition is chronic or acute. If a virus is causing your sinusitis, your symptoms will go away on their own within 10 days. You may be given medicines to relieve your symptoms,   including:  Topical nasal decongestants. They shrink swollen nasal passages and let mucus drain from your sinuses.  Antihistamines. These drugs block inflammation that is triggered by allergies. This can help to ease swelling in your nose and sinuses.  Topical nasal corticosteroids. These are nasal sprays that ease inflammation and swelling in your nose and sinuses.  Nasal saline washes. These rinses can help to get rid of thick mucus in  your nose.  If your condition is caused by bacteria, you will be given an antibiotic medicine. If your condition is caused by a fungus, you will be given an antifungal medicine. Surgery may be needed to correct underlying conditions, such as narrow nasal passages. Surgery may also be needed to remove polyps. Follow these instructions at home: Medicines  Take, use, or apply over-the-counter and prescription medicines only as told by your health care provider. These may include nasal sprays.  If you were prescribed an antibiotic medicine, take it as told by your health care provider. Do not stop taking the antibiotic even if you start to feel better. Hydrate and Humidify  Drink enough water to keep your urine clear or pale yellow. Staying hydrated will help to thin your mucus.  Use a cool mist humidifier to keep the humidity level in your home above 50%.  Inhale steam for 10-15 minutes, 3-4 times a day or as told by your health care provider. You can do this in the bathroom while a hot shower is running.  Limit your exposure to cool or dry air. Rest  Rest as much as possible.  Sleep with your head raised (elevated).  Make sure to get enough sleep each night. General instructions  Apply a warm, moist washcloth to your face 3-4 times a day or as told by your health care provider. This will help with discomfort.  Wash your hands often with soap and water to reduce your exposure to viruses and other germs. If soap and water are not available, use hand sanitizer.  Do not smoke. Avoid being around people who are smoking (secondhand smoke).  Keep all follow-up visits as told by your health care provider. This is important. Contact a health care provider if:  You have a fever.  Your symptoms get worse.  Your symptoms do not improve within 10 days. Get help right away if:  You have a severe headache.  You have persistent vomiting.  You have pain or swelling around your face or  eyes.  You have vision problems.  You develop confusion.  Your neck is stiff.  You have trouble breathing. This information is not intended to replace advice given to you by your health care provider. Make sure you discuss any questions you have with your health care provider. Document Released: 05/21/2005 Document Revised: 01/15/2016 Document Reviewed: 03/16/2015 Elsevier Interactive Patient Education  2017 Elsevier Inc.    Otitis Media, Adult Otitis media occurs when there is inflammation and fluid in the middle ear. Your middle ear is a part of the ear that contains bones for hearing as well as air that helps send sounds to your brain. What are the causes? This condition is caused by a blockage in the eustachian tube. This tube drains fluid from the ear to the back of the nose (nasopharynx). A blockage in this tube can be caused by an object or by swelling (edema) in the tube. Problems that can cause a blockage include:  A cold or other upper respiratory infection.  Allergies.  An irritant, such  as tobacco smoke.  Enlarged adenoids. The adenoids are areas of soft tissue located high in the back of the throat, behind the nose and the roof of the mouth.  A mass in the nasopharynx.  Damage to the ear caused by pressure changes (barotrauma).  What are the signs or symptoms? Symptoms of this condition include:  Ear pain.  A fever.  Decreased hearing.  A headache.  Tiredness (lethargy).  Fluid leaking from the ear.  Ringing in the ear.  How is this diagnosed? This condition is diagnosed with a physical exam. During the exam your health care provider will use an instrument called an otoscope to look into your ear and check for redness, swelling, and fluid. He or she will also ask about your symptoms. Your health care provider may also order tests, such as:  A test to check the movement of the eardrum (pneumatic otoscopy). This test is done by squeezing a small amount  of air into the ear.  A test that changes air pressure in the middle ear to check how well the eardrum moves and whether the eustachian tube is working (tympanogram).  How is this treated? This condition usually goes away on its own within 3-5 days. But if the condition is caused by a bacteria infection and does not go away own its own, or keeps coming back, your health care provider may:  Prescribe antibiotic medicines to treat the infection.  Prescribe or recommend medicines to control pain.  Follow these instructions at home:  Take over-the-counter and prescription medicines only as told by your health care provider.  If you were prescribed an antibiotic medicine, take it as told by your health care provider. Do not stop taking the antibiotic even if you start to feel better.  Keep all follow-up visits as told by your health care provider. This is important. Contact a health care provider if:  You have bleeding from your nose.  There is a lump on your neck.  You are not getting better in 5 days.  You feel worse instead of better. Get help right away if:  You have severe pain that is not controlled with medicine.  You have swelling, redness, or pain around your ear.  You have stiffness in your neck.  A part of your face is paralyzed.  The bone behind your ear (mastoid) is tender when you touch it.  You develop a severe headache. Summary  Otitis media is redness, soreness, and swelling of the middle ear.  This condition usually goes away on its own within 3-5 days.  If the problem does not go away in 3-5 days, your health care provider may prescribe or recommend medicines to treat your symptoms.  If you were prescribed an antibiotic medicine, take it as told by your health care provider. This information is not intended to replace advice given to you by your health care provider. Make sure you discuss any questions you have with your health care provider. Document  Released: 02/24/2004 Document Revised: 05/11/2016 Document Reviewed: 05/11/2016 Elsevier Interactive Patient Education  2017 [employer]    IF you received an x-ray today, you will receive an invoice from [facility]. Please contact [facility] at [phone] with questions or concerns regarding your invoice.   IF you received labwork today, you will receive an invoice from [facility]. Please contact [facility] at [phone] with questions or concerns regarding your invoice.   Our billing staff will not be able to assist you with questions regarding  bills from these companies.  You will be contacted with the lab results as soon as they are available. The fastest way to get your results is to activate your My Chart account. Instructions are located on the last page of this paperwork. If you have not heard from [place] regarding the results in 2 weeks, please contact this office.

## 2017-07-25 ENCOUNTER — Encounter: Payer: Self-pay | Admitting: Physician Assistant

## 2017-07-25 ENCOUNTER — Other Ambulatory Visit: Payer: Self-pay

## 2017-07-25 ENCOUNTER — Ambulatory Visit: Payer: BC Managed Care – PPO | Admitting: Physician Assistant

## 2017-07-25 VITALS — BP 120/79 | HR 80 | Temp 98.3°F | Resp 16 | Ht 72.0 in | Wt 197.8 lb

## 2017-07-25 DIAGNOSIS — J101 Influenza due to other identified influenza virus with other respiratory manifestations: Secondary | ICD-10-CM

## 2017-07-25 DIAGNOSIS — R05 Cough: Secondary | ICD-10-CM | POA: Diagnosis not present

## 2017-07-25 DIAGNOSIS — R059 Cough, unspecified: Secondary | ICD-10-CM

## 2017-07-25 LAB — POCT INFLUENZA A/B
INFLUENZA A, POC: POSITIVE — AB
INFLUENZA B, POC: NEGATIVE

## 2017-07-25 MED ORDER — IPRATROPIUM BROMIDE 0.03 % NA SOLN: 2.0000 | Freq: Two times a day (BID) | NASAL | 0 refills | Status: DC

## 2017-07-25 MED ORDER — HYDROCOD POLST-CPM POLST ER 10-8 MG/5ML PO SUER: 5.0000 mL | Freq: Every evening | ORAL | 0 refills | Status: DC | PRN

## 2017-07-25 MED ORDER — GUAIFENESIN ER 1200 MG PO TB12: 1.0000 | ORAL_TABLET | Freq: Two times a day (BID) | ORAL | 1 refills | Status: DC | PRN

## 2017-07-25 NOTE — Patient Instructions (Addendum)
Please push hydration for 64 oz of water if not more I would like you to take ibuprofen for pain and fever.  This can be 600mg  every 8 hours as needed.  Influenza, Adult Influenza ("the flu") is an infection in the lungs, nose, and throat (respiratory tract). It is caused by a virus. The flu causes many common cold symptoms, as well as a high fever and body aches. It can make you feel very sick. The flu spreads easily from person to person (is contagious). Getting a flu shot (influenza vaccination) every year is the best way to prevent the flu. Follow these instructions at home:  Take over-the-counter and prescription medicines only as told by your doctor.  Use a cool mist humidifier to add moisture (humidity) to the air in your home. This can make it easier to breathe.  Rest as needed.  Drink enough fluid to keep your pee (urine) clear or pale yellow.  Cover your mouth and nose when you cough or sneeze.  Wash your hands with soap and water often, especially after you cough or sneeze. If you cannot use soap and water, use hand sanitizer.  Stay home from work or school as told by your doctor. Unless you are visiting your doctor, try to avoid leaving home until your fever has been gone for 24 hours without the use of medicine.  Keep all follow-up visits as told by your doctor. This is important. How is this prevented?  Getting a yearly (annual) flu shot is the best way to avoid getting the flu. You may get the flu shot in late summer, fall, or winter. Ask your doctor when you should get your flu shot.  Wash your hands often or use hand sanitizer often.  Avoid contact with people who are sick during cold and flu season.  Eat healthy foods.  Drink plenty of fluids.  Get enough sleep.  Exercise regularly. Contact a doctor if:  You get new symptoms.  You have: ? Chest pain. ? Watery poop (diarrhea). ? A fever.  Your cough gets worse.  You start to have more mucus.  You  feel sick to your stomach (nauseous).  You throw up (vomit). Get help right away if:  You start to be short of breath or have trouble breathing.  Your skin or nails turn a bluish color.  You have very bad pain or stiffness in your neck.  You get a sudden headache.  You get sudden pain in your face or ear.  You cannot stop throwing up. This information is not intended to replace advice given to you by your health care provider. Make sure you discuss any questions you have with your health care provider. Document Released: 02/28/2008 Document Revised: 10/27/2015 Document Reviewed: 03/15/2015 Elsevier Interactive Patient Education  2017 [employer]     IF you received an x-ray today, you will receive an invoice from [facility]. Please contact [facility] at [phone] with questions or concerns regarding your invoice.   IF you received labwork today, you will receive an invoice from [facility]. Please contact [facility] at [phone] with questions or concerns regarding your invoice.   Our billing staff will not be able to assist you with questions regarding bills from these companies.  You will be contacted with the lab results as soon as they are available. The fastest way to get your results is to activate your My Chart account. Instructions are located on the last page of this paperwork. If you have not  heard from [place] regarding the results in 2 weeks, please contact this office.

## 2017-07-25 NOTE — Progress Notes (Signed)
[facility] AT [facility] [address], [place] [phone]  Date:  07/25/2017   Name:  Manuel Johnson   DOB:  01/04/1971   MRN:  [id number]  PCP:  Joselyn ArrowKnapp, Eve, MD    History of Present Illness:  Manuel Johnson is a [age] y.o. male patient who presents to PCP with  Chief Complaint  Patient presents with  . URI    n/v, cold chills/ lethargic, loss of appetite, can't sleep x 3days      Cold chills Nausea and emesis  Loss of appetite Cold chills.   Difficulty sleeping due to body aches and chills Sore throat minimal.  Patient Active Problem List   Diagnosis Date Noted  . Chest discomfort 09/22/2016  . Palpitations 09/22/2016  . Routine general medical examination at a health care facility 08/29/2016  . Elevated lipids 08/29/2016  . Family history of heart disease 08/29/2016  . Varicose vein of leg 08/29/2016  . Insomnia 12/13/2014  . Rhinitis, allergic 12/13/2014  . S/P LASIK surgery 12/13/2014  . Abnormal vision screen 12/13/2014    Past Medical History:  Diagnosis Date  . Insomnia 2015  . S/P LASIK surgery   . Seasonal allergies     Past Surgical History:  Procedure Laterality Date  . LASIK  2006    Social History   Tobacco Use  . Smoking status: Former Smoker    Packs/day: 1.00    Years: 5.00    Pack years: 5.00    Last attempt to quit: 10/10/2010    Years since quitting: 6.7  . Smokeless tobacco: Never Used  Substance Use Topics  . Alcohol use: Yes    Alcohol/week: 3.6 oz    Types: 6 Cans of beer per week    Comment: 6 drinks per week (weekends)  . Drug use: No    Family History  Problem Relation Age of Onset  . Alzheimer's disease Mother   . Heart disease Mother        angina  . Other Mother        died at 46yo from MVA  . Alcohol abuse Father   . Cancer Neg Hx   . Stroke Neg Hx   . Hypertension Neg Hx   . Diabetes Neg Hx     Allergies  Allergen Reactions  . Prednisone Hives    Medication list has been reviewed and  updated.  Current Outpatient Medications on File Prior to Visit  Medication Sig Dispense Refill  . amoxicillin (AMOXIL) 875 MG tablet Take 1 tablet (875 mg total) 2 (two) times daily by mouth. (Patient not taking: Reported on 07/25/2017) 20 tablet 0  . benzonatate (TESSALON) 100 MG capsule Take 1-2 capsules (100-200 mg total) 3 (three) times daily as needed by mouth. (Patient not taking: Reported on 07/25/2017) 60 capsule 0  . chlorpheniramine-HYDROcodone (TUSSIONEX PENNKINETIC ER) 10-8 MG/5ML SUER Take 5 mLs at bedtime as needed by mouth. (Patient not taking: Reported on 07/25/2017) 100 mL 0  . pseudoephedrine (SUDAFED 12 HOUR) 120 MG 12 hr tablet Take 1 tablet (120 mg total) 2 (two) times daily by mouth. (Patient not taking: Reported on 07/25/2017) 30 tablet 3   No current facility-administered medications on file prior to visit.     ROS ROS otherwise unremarkable unless listed above.  Physical Examination: BP 120/79   Pulse 80   Temp 98.3 F (36.8 C)   Resp 16   Ht 6' (1.829 m)   Wt 197 lb 12.8 oz (89.7 kg)  SpO2 98%   BMI 26.83 kg/m  Ideal Body Weight: Weight in (lb) to have BMI = 25: 183.9  Physical Exam  Constitutional: He is oriented to person, place, and time. He appears well-developed and well-nourished. No distress.  HENT:  Head: Normocephalic and atraumatic.  Right Ear: Tympanic membrane, external ear and ear canal normal.  Left Ear: Tympanic membrane, external ear and ear canal normal.  Nose: Mucosal edema and rhinorrhea present. Right sinus exhibits no maxillary sinus tenderness and no frontal sinus tenderness. Left sinus exhibits no maxillary sinus tenderness and no frontal sinus tenderness.  Mouth/Throat: No uvula swelling. No oropharyngeal exudate, posterior oropharyngeal edema or posterior oropharyngeal erythema.  Eyes: Conjunctivae, EOM and lids are normal. Pupils are equal, round, and reactive to light. Right eye exhibits normal extraocular motion. Left eye  exhibits normal extraocular motion.  Neck: Trachea normal and full passive range of motion without pain. No edema and no erythema present.  Cardiovascular: Normal rate.  Pulmonary/Chest: Effort normal. No respiratory distress. He has no decreased breath sounds. He has no wheezes. He has no rhonchi.  Abdominal: Soft. Normal appearance and bowel sounds are normal. There is no tenderness.  Neurological: He is alert and oriented to person, place, and time.  Skin: Skin is warm and dry. He is not diaphoretic.  Psychiatric: He has a normal mood and affect. His behavior is normal.    Results for orders placed or performed in visit on 07/25/17  POCT Influenza A/B  Result Value Ref Range   Influenza A, POC Positive (A) Negative   Influenza B, POC Negative Negative    Assessment and Plan: Manuel Johnson is a 47 y.o. male who is here today for cc of  Chief Complaint  Patient presents with  . URI    n/v, cold chills/ lethargic, loss of appetite, can't sleep x 3days   out of time line for treatment.  Treating supportively.  Advised cough syrup at this time.  Alarming sxs given to warrant immediate return Discussed hydration antipyretic use Influenza A - Plan: ipratropium (ATROVENT) 0.03 % nasal spray, Guaifenesin (MUCINEX MAXIMUM STRENGTH) 1200 MG TB12, chlorpheniramine-HYDROcodone (TUSSIONEX PENNKINETIC ER) 10-8 MG/5ML SUER  Cough - Plan: POCT Influenza A/B  Trena Platt, PA-C [facility] 3/1/20192:22 PM

## 2017-08-21 ENCOUNTER — Other Ambulatory Visit: Payer: Self-pay | Admitting: Physician Assistant

## 2017-08-21 DIAGNOSIS — J101 Influenza due to other identified influenza virus with other respiratory manifestations: Secondary | ICD-10-CM

## 2017-08-21 NOTE — Telephone Encounter (Signed)
Ipratropium (Atrovent) Last OV: 07/25/17 Last Refill:07/25/17 Pharmacy:[facility] [address] PCP: Trena PlattStephanie English PA

## 2017-09-11 ENCOUNTER — Encounter: Payer: Self-pay | Admitting: Physician Assistant

## 2017-09-17 ENCOUNTER — Other Ambulatory Visit: Payer: Self-pay | Admitting: Physician Assistant

## 2017-09-17 DIAGNOSIS — J101 Influenza due to other identified influenza virus with other respiratory manifestations: Secondary | ICD-10-CM

## 2017-10-22 ENCOUNTER — Encounter: Payer: Self-pay | Admitting: Medical

## 2017-10-22 ENCOUNTER — Ambulatory Visit: Payer: BC Managed Care – PPO | Admitting: Medical

## 2017-10-22 VITALS — BP 130/88 | HR 80 | Temp 97.8°F | Ht 72.0 in | Wt 194.8 lb

## 2017-10-22 DIAGNOSIS — R5383 Other fatigue: Secondary | ICD-10-CM | POA: Diagnosis not present

## 2017-10-22 DIAGNOSIS — R42 Dizziness and giddiness: Secondary | ICD-10-CM

## 2017-10-22 DIAGNOSIS — R400 Somnolence: Secondary | ICD-10-CM

## 2017-10-22 DIAGNOSIS — R0683 Snoring: Secondary | ICD-10-CM | POA: Diagnosis not present

## 2017-10-22 DIAGNOSIS — G479 Sleep disorder, unspecified: Secondary | ICD-10-CM | POA: Diagnosis not present

## 2017-10-22 DIAGNOSIS — R002 Palpitations: Secondary | ICD-10-CM | POA: Diagnosis not present

## 2017-10-22 LAB — POCT URINALYSIS DIP (PROADVANTAGE DEVICE)
BILIRUBIN UA: NEGATIVE
BILIRUBIN UA: NEGATIVE mg/dL
Glucose, UA: NEGATIVE mg/dL
Leukocytes, UA: NEGATIVE
NITRITE UA: NEGATIVE
PH UA: 6 (ref 5.0–8.0)
Protein Ur, POC: NEGATIVE mg/dL
RBC UA: NEGATIVE

## 2017-10-22 NOTE — Progress Notes (Signed)
Subjective: Chief Complaint  Patient presents with  . Hypertension    light headed when moving around    Here for concerns.   Lately been feeling lightheaded walking around the last several days.  Today he felt that way and had his blood pressure checked at school.  A health education teacher checked it and got 164/110 right arm.  Later a school nurse checked it and got a normal reading of 130/86.  When he feels lightheaded he can last up to 20 minutes.  Feels like he is going to pass out.  He reports drinking plenty of water, and is currently back on caffeine other than may be a soda on the weekend.  Eats breakfast, doesn't skip meals.  He does report some dry mouth.  He has frequent urination but he drinks a lot of water in general.  His wife says he does snore loud, he sometimes makes noises waking himself up but no obvious witnessed apnea.  He feels no energy often of late.  His father snored loud.  But no family history of sleep apnea.  He denies chest pain, shortness of breath, swelling.  He has felt some palpitations today.   Past Medical History:  Diagnosis Date  . Insomnia 2015  . S/P LASIK surgery   . Seasonal allergies    Current Outpatient Medications on File Prior to Visit  Medication Sig Dispense Refill  . ipratropium (ATROVENT) 0.03 % nasal spray PLACE 2 SPRAYS INTO BOTH NOSTRILS 2 (TWO) TIMES DAILY. 30 mL 0   No current facility-administered medications on file prior to visit.    ROS as in subjective    Objective: BP 130/88   Pulse 80   Temp 97.8 F (36.6 C) (Oral)   Ht 6' (1.829 m)   Wt 194 lb 12.8 oz (88.4 kg)   SpO2 97%   BMI 26.42 kg/m   Wt Readings from Last 3 Encounters:  10/22/17 194 lb 12.8 oz (88.4 kg)  07/25/17 197 lb 12.8 oz (89.7 kg)  04/19/17 192 lb 12.8 oz (87.5 kg)   BP Readings from Last 3 Encounters:  10/22/17 130/88  07/25/17 120/79  04/19/17 122/82     General appearance: alert, no distress, WD/WN, white male Oral cavity: MMM,  no lesions Neck: supple, no lymphadenopathy, no thyromegaly, no masses, no bruits, no JVD Heart: RRR, normal S1, S2, no murmurs Lungs: CTA bilaterally, no wheezes, rhonchi, or rales Extremities: no edema, no cyanosis, no clubbing Pulses: 2+ symmetric, upper and lower extremities, normal cap refill Neurological: alert, oriented x 3, CN2-12 intact, strength normal upper extremities and lower extremities, sensation normal throughout, DTRs 2+ throughout, no cerebellar signs, gait normal Psychiatric: normal affect, behavior normal, pleasant    Adult ECG Report  Indication: lightheaded and palpitation  Rate: 63 bpm  Rhythm: normal sinus rhythm  QRS Axis: 30 degrees  PR Interval:  QRS Duration: 80ms  QTc:  Conduction Disturbances: none  Other Abnormalities: none  Patient's cardiac risk factors are: male gender.  EKG comparison:3/ 2018  Narrative Interpretation: no RBBB as with 2018 EKG,normal appearing EKG, nonspecific change in III   Assessment: Encounter Diagnoses  Name Primary?  . Lightheaded Yes  . Palpitation   . Snoring   . Sleep disturbance   . Daytime somnolence   . Other fatigue     Plan: discussed differential which is wide. Reviewed EKG, UA.   F/u pending labs.   Consider sleep study.   Reviewed 2018 stress test results from  cardiology.   Discussed symptoms and signs of OSA, low testosterone, thyroid issues.   Limit alcohol or use 1 or less drinks on a given day.  Amar was seen today for hypertension.  Diagnoses and all orders for this visit:  Lightheaded -     Basic metabolic panel -     CBC -     EKG 12-Lead -     POCT urinalysis dipstick -     Testosterone  Palpitation -     Basic metabolic panel -     CBC -     EKG 12-Lead -     POCT urinalysis dipstick -     Testosterone  Snoring -     Basic metabolic panel -     CBC -     EKG 12-Lead -     Testosterone  Sleep disturbance -     Basic metabolic panel -     CBC -     EKG 12-Lead -      Testosterone  Daytime somnolence -     Basic metabolic panel -     CBC -     EKG 12-Lead -     Testosterone  Other fatigue -     TSH

## 2017-10-23 ENCOUNTER — Other Ambulatory Visit: Payer: Self-pay

## 2017-10-23 DIAGNOSIS — R0683 Snoring: Secondary | ICD-10-CM

## 2017-10-23 LAB — BASIC METABOLIC PANEL
BUN / CREAT RATIO: 14 (ref 9–20)
BUN: 14 mg/dL (ref 6–24)
CO2: 24 mmol/L (ref 20–29)
CREATININE: 1.01 mg/dL (ref 0.76–1.27)
Calcium: 10.2 mg/dL (ref 8.7–10.2)
Chloride: 99 mmol/L (ref 96–106)
GFR calc Af Amer: 103 mL/min/{1.73_m2} (ref >59)
GFR, EST NON AFRICAN AMERICAN: 89 mL/min/{1.73_m2} (ref >59)
Glucose: 93 mg/dL (ref 65–99)
Potassium: 5 mmol/L (ref 3.5–5.2)
Sodium: 140 mmol/L (ref 134–144)

## 2017-10-23 LAB — TESTOSTERONE: Testosterone: 438 ng/dL (ref 264–916)

## 2017-10-23 LAB — TSH: TSH: 1.88 u[IU]/mL (ref 0.450–4.500)

## 2017-10-23 LAB — CBC
HEMATOCRIT: 45.6 % (ref 37.5–51.0)
Hemoglobin: 14.9 g/dL (ref 13.0–17.7)
MCH: 29.3 pg (ref 26.6–33.0)
MCHC: 32.7 g/dL (ref 31.5–35.7)
MCV: 90 fL (ref 79–97)
Platelets: 404 10*3/uL (ref 150–450)
RBC: 5.08 x10E6/uL (ref 4.14–5.80)
RDW: 13.9 % (ref 12.3–15.4)
WBC: 6.4 10*3/uL (ref 3.4–10.8)

## 2017-11-11 ENCOUNTER — Other Ambulatory Visit: Payer: Self-pay

## 2017-11-11 DIAGNOSIS — R0683 Snoring: Secondary | ICD-10-CM

## 2017-11-11 DIAGNOSIS — G479 Sleep disorder, unspecified: Secondary | ICD-10-CM

## 2018-03-20 ENCOUNTER — Ambulatory Visit: Payer: Self-pay | Admitting: *Deleted

## 2018-03-20 NOTE — Telephone Encounter (Signed)
Pt reports productive cough, sinus tenderness, right earache and generalized weakness x 2 weeks. States "Every year starts out as an allergy problem and develops into this." Denies fever, SOB, CP.  States has been taking Musinex DM, benadryl at HS and saline nasal spray with minimal relief. Cough productive for brownish phlegm, denies any hemoptysis. States "Neck glands tender."  Appt made for tomorrow with Dr. Neva Seat. Care advise given. Reason for Disposition . [1] Continuous (nonstop) coughing interferes with work or school AND [2] no improvement using cough treatment per Care Advice  Answer Assessment - Initial Assessment Questions 1. ONSET: "When did the cough begin?"      2 weeks ago 2. SEVERITY: "How bad is the cough today?"      "Bad" Worse at night, can't sleep. Cough medicine helps at night. 3. RESPIRATORY DISTRESS: "Describe your breathing."      Occasional wheeze, not often. 4. FEVER: "Do you have a fever?" If so, ask: "What is your temperature, how was it measured, and when did it start?"     no 5. SPUTUM: "Describe the color of your sputum" (clear, white, yellow, green)     brownish 6. HEMOPTYSIS: "Are you coughing up any blood?" If so ask: "How much?" (flecks, streaks, tablespoons, etc.)     no 7. CARDIAC HISTORY: "Do you have any history of heart disease?" (e.g., heart attack, congestive heart failure)     no 8. LUNG HISTORY: "Do you have any history of lung disease?"  (e.g., pulmonary embolus, asthma, emphysema)    Bronchitis 2-3 times in past. 9. PE RISK FACTORS: "Do you have a history of blood clots?" (or: recent major surgery, recent prolonged travel, bedridden)     no 10. OTHER SYMPTOMS: "Do you have any other symptoms?" (e.g., runny nose, wheezing, chest pain)     Generalized weakness, not sleeping; allergies, neck glands swelling, tenderness sinus area, right earache  Protocols used: COUGH - ACUTE PRODUCTIVE-A-AH

## 2018-03-21 ENCOUNTER — Encounter: Payer: Self-pay | Admitting: Family Medicine

## 2018-03-21 ENCOUNTER — Ambulatory Visit: Payer: BC Managed Care – PPO | Admitting: Family Medicine

## 2018-03-21 VITALS — BP 136/84 | HR 86 | Temp 98.0°F | Resp 17 | Ht 72.0 in | Wt 198.0 lb

## 2018-03-21 DIAGNOSIS — R059 Cough, unspecified: Secondary | ICD-10-CM

## 2018-03-21 DIAGNOSIS — J22 Unspecified acute lower respiratory infection: Secondary | ICD-10-CM

## 2018-03-21 DIAGNOSIS — R05 Cough: Secondary | ICD-10-CM | POA: Diagnosis not present

## 2018-03-21 MED ORDER — IPRATROPIUM BROMIDE 0.03 % NA SOLN: 2.0000 | Freq: Two times a day (BID) | NASAL | 0 refills | Status: DC

## 2018-03-21 MED ORDER — AZITHROMYCIN 250 MG PO TABS: ORAL_TABLET | ORAL | 0 refills | Status: DC

## 2018-03-21 MED ORDER — BENZONATATE 100 MG PO CAPS: 100.0000 mg | ORAL_CAPSULE | Freq: Three times a day (TID) | ORAL | 0 refills | Status: DC | PRN

## 2018-03-21 MED ORDER — HYDROCODONE-HOMATROPINE 5-1.5 MG/5ML PO SYRP: ORAL_SOLUTION | ORAL | 0 refills | Status: DC

## 2018-03-21 NOTE — Patient Instructions (Addendum)
Z-Pak for chest symptoms, Mucinex or Tessalon Perles during the day as needed for cough, hydrocodone cough syrup at night as needed.  I did refill the nasal spray if needed as well.  Make sure to drink fluids and rest as possible, please let me know if there are any questions.   Cough, Adult Coughing is a reflex that clears your throat and your airways. Coughing helps to heal and protect your lungs. It is normal to cough occasionally, but a cough that happens with other symptoms or lasts a long time may be a sign of a condition that needs treatment. A cough may last only 2-3 weeks (acute), or it may last longer than 8 weeks (chronic). What are the causes? Coughing is commonly caused by:  Breathing in substances that irritate your lungs.  A viral or bacterial respiratory infection.  Allergies.  Asthma.  Postnasal drip.  Smoking.  Acid backing up from the stomach into the esophagus (gastroesophageal reflux).  Certain medicines.  Chronic lung problems, including COPD (or rarely, lung cancer).  Other medical conditions such as heart failure.  Follow these instructions at home: Pay attention to any changes in your symptoms. Take these actions to help with your discomfort:  Take medicines only as told by your health care provider. ? If you were prescribed an antibiotic medicine, take it as told by your health care provider. Do not stop taking the antibiotic even if you start to feel better. ? Talk with your health care provider before you take a cough suppressant medicine.  Drink enough fluid to keep your urine clear or pale yellow.  If the air is dry, use a cold steam vaporizer or humidifier in your bedroom or your home to help loosen secretions.  Avoid anything that causes you to cough at work or at home.  If your cough is worse at night, try sleeping in a semi-upright position.  Avoid cigarette smoke. If you smoke, quit smoking. If you need help quitting, ask your health  care provider.  Avoid caffeine.  Avoid alcohol.  Rest as needed.  Contact a health care provider if:  You have new symptoms.  You cough up pus.  Your cough does not get better after 2-3 weeks, or your cough gets worse.  You cannot control your cough with suppressant medicines and you are losing sleep.  You develop pain that is getting worse or pain that is not controlled with pain medicines.  You have a fever.  You have unexplained weight loss.  You have night sweats. Get help right away if:  You cough up blood.  You have difficulty breathing.  Your heartbeat is very fast. This information is not intended to replace advice given to you by your health care provider. Make sure you discuss any questions you have with your health care provider. Document Released: 11/17/2010 Document Revised: 10/27/2015 Document Reviewed: 07/28/2014 Elsevier Interactive Patient Education  [employer].   If you have lab work done today you will be contacted with your lab results within the next 2 weeks.  If you have not heard from [place] then please contact us. The fastest way to get your results is to register for My Chart.   IF you received an x-ray today, you will receive an invoice from [facility]. Please contact [facility] at [phone] with questions or concerns regarding your invoice.   IF you received labwork today, you will receive an invoice from [facility]. Please contact [facility] at [phone] with questions  or concerns regarding your invoice.   Our billing staff will not be able to assist you with questions regarding bills from these companies.  You will be contacted with the lab results as soon as they are available. The fastest way to get your results is to activate your My Chart account. Instructions are located on the last page of this paperwork. If you have not heard from [place] regarding the results in 2 weeks, please contact this office.

## 2018-03-21 NOTE — Progress Notes (Signed)
Subjective:    Patient ID: Manuel Johnson, male    DOB: 1970-11-27, 47 y.o.   MRN: [id number]  HPI Manuel Johnson is a 47 y.o. male Presents today for: Chief Complaint  Patient presents with  . Cough  . Sinusitis   Started with nasal congestion, ear fullness about 2 weeks ago. Cough present for 2 weeks - felt worse cough, no dyspnea. Fatigue, drained.  Min face pain, but chest congestion is worse. Discolored phlegm.  Cough keeping up at night.   Nonsmoker, no hx of asthma.  Tx:  Benadryl at night, sudafed.  atrovent ns- running out. Tessalon perles.   Son with illness recently as well.   Patient Active Problem List   Diagnosis Date Noted  . Chest discomfort 09/22/2016  . Palpitations 09/22/2016  . Routine general medical examination at a health care facility 08/29/2016  . Elevated lipids 08/29/2016  . Family history of heart disease 08/29/2016  . Varicose vein of leg 08/29/2016  . Insomnia 12/13/2014  . Rhinitis, allergic 12/13/2014  . S/P LASIK surgery 12/13/2014  . Abnormal vision screen 12/13/2014   Past Medical History:  Diagnosis Date  . Insomnia 2015  . S/P LASIK surgery   . Seasonal allergies    Past Surgical History:  Procedure Laterality Date  . LASIK  2006   Allergies  Allergen Reactions  . Prednisone Hives   Prior to Admission medications   Medication Sig Start Date End Date Taking? Authorizing Provider  ipratropium (ATROVENT) 0.03 % nasal spray PLACE 2 SPRAYS INTO BOTH NOSTRILS 2 (TWO) TIMES DAILY. Patient not taking: Reported on 03/21/2018 08/21/17   Garnetta Buddy, PA   Social History   Socioeconomic History  . Marital status: Married    Spouse name: Not on file  . Number of children: Not on file  . Years of education: Not on file  . Highest education level: Not on file  Occupational History  . Occupation: principal    Employer: [employer]    Comment: [employer]  Social Needs  . Financial resource strain:  Not on file  . Food insecurity:    Worry: Not on file    Inability: Not on file  . Transportation needs:    Medical: Not on file    Non-medical: Not on file  Tobacco Use  . Smoking status: Former Smoker    Packs/day: 1.00    Years: 5.00    Pack years: 5.00    Last attempt to quit: 10/10/2010    Years since quitting: 7.4  . Smokeless tobacco: Never Used  Substance and Sexual Activity  . Alcohol use: Yes    Alcohol/week: 6.0 standard drinks    Types: 6 Cans of beer per week    Comment: 6 drinks per week (weekends)  . Drug use: No  . Sexual activity: Yes    Birth control/protection: None  Lifestyle  . Physical activity:    Days per week: Not on file    Minutes per session: Not on file  . Stress: Not on file  Relationships  . Social connections:    Talks on phone: Not on file    Gets together: Not on file    Attends religious service: Not on file    Active member of club or organization: Not on file    Attends meetings of clubs or organizations: Not on file    Relationship status: Not on file  . Intimate partner violence:    Fear of current  or ex partner: Not on file    Emotionally abused: Not on file    Physically abused: Not on file    Forced sexual activity: Not on file  Other Topics Concern  . Not on file  Social History Narrative   Native [place] [facility]).  Grew up in [place], [place] (Is a [employer] supporter). 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  [name], [name] 6yo.  Exercise daily, 6mi daily with fit bit, running, weights.  08/2016   He enjoys having a few pints of beer on the weekends with friends while watching soccer matches.  He does not drink much or often.    Review of Systems     Objective:   Physical Exam  Constitutional: He is oriented to person, place, and time. He appears well-developed and well-nourished.  HENT:  Head: Normocephalic and atraumatic.  Right Ear: Tympanic membrane, external ear and ear canal normal.    Left Ear: Tympanic membrane, external ear and ear canal normal.  Nose: No rhinorrhea.  Mouth/Throat: Oropharynx is clear and moist and mucous membranes are normal. No oropharyngeal exudate or posterior oropharyngeal erythema.  Eyes: Pupils are equal, round, and reactive to light. Conjunctivae are normal.  Neck: Neck supple.  Cardiovascular: Normal rate, regular rhythm, normal heart sounds and intact distal pulses.  No murmur heard. Pulmonary/Chest: Effort normal and breath sounds normal. He has no wheezes. He has no rhonchi. He has no rales.  Abdominal: Soft. There is no tenderness.  Lymphadenopathy:    He has no cervical adenopathy.  Neurological: He is alert and oriented to person, place, and time.  Skin: Skin is warm and dry. No rash noted.  Psychiatric: He has a normal mood and affect. His behavior is normal.  Vitals reviewed.  Vitals:   03/21/18 1200  BP: 136/84  Pulse: 86  Resp: 17  Temp: 98 F (36.7 C)  TempSrc: Oral  SpO2: 98%  Weight: 198 lb (89.8 kg)  Height: 6' (1.829 m)      Assessment & Plan:    Manuel Johnson is a 47 y.o. male LRTI (lower respiratory tract infection) - Plan: ipratropium (ATROVENT) 0.03 % nasal spray, benzonatate (TESSALON) 100 MG capsule, HYDROcodone-homatropine (HYCODAN) 5-1.5 MG/5ML syrup, azithromycin (ZITHROMAX) 250 MG tablet  Cough - Plan: ipratropium (ATROVENT) 0.03 % nasal spray, benzonatate (TESSALON) 100 MG capsule, HYDROcodone-homatropine (HYCODAN) 5-1.5 MG/5ML syrup, azithromycin (ZITHROMAX) 250 MG tablet   No respiratory infection, possible bronchitis with worsening cough.  Vital signs reassuring, exam reassuring, imaging and blood work deferred at this time.   -Start azithromycin, side effects discussed.  Continue Atrovent nasal spray, Tessalon Perles or Mucinex for cough during the day, hydrocodone cough syrup at night if needed, potential side effects and risk discussed, other symptomatic care reviewed and RTC precautions given.     Meds ordered this encounter  Medications  . ipratropium (ATROVENT) 0.03 % nasal spray    Sig: Place 2 sprays into both nostrils 2 (two) times daily.    Dispense:  30 mL    Refill:  0  . benzonatate (TESSALON) 100 MG capsule    Sig: Take 1 capsule (100 mg total) by mouth 3 (three) times daily as needed for cough.    Dispense:  20 capsule    Refill:  0  . HYDROcodone-homatropine (HYCODAN) 5-1.5 MG/5ML syrup    Sig: 20m by mouth a bedtime as needed for cough.    Dispense:  120 mL    Refill:  0  .  azithromycin (ZITHROMAX) 250 MG tablet    Sig: Take 2 pills by mouth on day 1, then 1 pill by mouth per day on days 2 through 5.    Dispense:  6 tablet    Refill:  0   Patient Instructions     Z-Pak for chest symptoms, Mucinex or Tessalon Perles during the day as needed for cough, hydrocodone cough syrup at night as needed.  I did refill the nasal spray if needed as well.  Make sure to drink fluids and rest as possible, please let me know if there are any questions.   Cough, Adult Coughing is a reflex that clears your throat and your airways. Coughing helps to heal and protect your lungs. It is normal to cough occasionally, but a cough that happens with other symptoms or lasts a long time may be a sign of a condition that needs treatment. A cough may last only 2-3 weeks (acute), or it may last longer than 8 weeks (chronic). What are the causes? Coughing is commonly caused by:  Breathing in substances that irritate your lungs.  A viral or bacterial respiratory infection.  Allergies.  Asthma.  Postnasal drip.  Smoking.  Acid backing up from the stomach into the esophagus (gastroesophageal reflux).  Certain medicines.  Chronic lung problems, including COPD (or rarely, lung cancer).  Other medical conditions such as heart failure.  Follow these instructions at home: Pay attention to any changes in your symptoms. Take these actions to help with your discomfort:  Take  medicines only as told by your health care provider. ? If you were prescribed an antibiotic medicine, take it as told by your health care provider. Do not stop taking the antibiotic even if you start to feel better. ? Talk with your health care provider before you take a cough suppressant medicine.  Drink enough fluid to keep your urine clear or pale yellow.  If the air is dry, use a cold steam vaporizer or humidifier in your bedroom or your home to help loosen secretions.  Avoid anything that causes you to cough at work or at home.  If your cough is worse at night, try sleeping in a semi-upright position.  Avoid cigarette smoke. If you smoke, quit smoking. If you need help quitting, ask your health care provider.  Avoid caffeine.  Avoid alcohol.  Rest as needed.  Contact a health care provider if:  You have new symptoms.  You cough up pus.  Your cough does not get better after 2-3 weeks, or your cough gets worse.  You cannot control your cough with suppressant medicines and you are losing sleep.  You develop pain that is getting worse or pain that is not controlled with pain medicines.  You have a fever.  You have unexplained weight loss.  You have night sweats. Get help right away if:  You cough up blood.  You have difficulty breathing.  Your heartbeat is very fast. This information is not intended to replace advice given to you by your health care provider. Make sure you discuss any questions you have with your health care provider. Document Released: 11/17/2010 Document Revised: 10/27/2015 Document Reviewed: 07/28/2014 Elsevier Interactive Patient Education  [employer].   If you have lab work done today you will be contacted with your lab results within the next 2 weeks.  If you have not heard from [place] then please contact us. The fastest way to get your results is to register for My Chart.  IF you received an x-ray today, you will receive an invoice  from [facility]. Please contact [facility] at [phone] with questions or concerns regarding your invoice.   IF you received labwork today, you will receive an invoice from [place]. Please contact [facility] at [phone] with questions or concerns regarding your invoice.   Our billing staff will not be able to assist you with questions regarding bills from these companies.  You will be contacted with the lab results as soon as they are available. The fastest way to get your results is to activate your My Chart account. Instructions are located on the last page of this paperwork. If you have not heard from [place] regarding the results in 2 weeks, please contact this office.       Signed,   Meredith Staggers, MD Primary Care at [facility].  03/25/18 3:31 PM

## 2018-03-25 ENCOUNTER — Encounter: Payer: Self-pay | Admitting: Family Medicine

## 2018-04-12 ENCOUNTER — Other Ambulatory Visit: Payer: Self-pay | Admitting: Family Medicine

## 2018-04-12 DIAGNOSIS — R059 Cough, unspecified: Secondary | ICD-10-CM

## 2018-04-12 DIAGNOSIS — R05 Cough: Secondary | ICD-10-CM

## 2018-04-12 DIAGNOSIS — J22 Unspecified acute lower respiratory infection: Secondary | ICD-10-CM

## 2018-04-14 NOTE — Telephone Encounter (Signed)
Requested medication (s) are due for refill today: yes  Requested medication (s) are on the active medication list: yes    Last refill: 03/21/18  Future visit scheduled no  Notes to clinic:off protocol  Requested Prescriptions  Pending Prescriptions Disp Refills   ipratropium (ATROVENT) 0.03 % nasal spray [Pharmacy Med Name: IPRATROPIUM 0.03% SPRAY]  0    Sig: Place 2 sprays into both nostrils 2 (two) times daily.     Off-Protocol Failed - 04/12/2018  8:35 AM      Failed - Medication not assigned to a protocol, review manually.      Passed - Valid encounter within last 12 months    Recent Outpatient Visits          3 weeks ago LRTI (lower respiratory tract infection)   Primary Care at [place], Asencion Partridge, MD   8 months ago Influenza A   Primary Care at [place], [place], [place]   12 months ago Other acute sinusitis, recurrence not specified   Primary Care at [facility], [place], [place]   2 years ago Drug allergy   [facility] at [facility], Manus Rudd, MD   2 years ago Acute recurrent frontal sinusitis   Primary Care at [facility], [place], [place]

## 2018-07-14 ENCOUNTER — Other Ambulatory Visit: Payer: Self-pay

## 2018-07-14 ENCOUNTER — Ambulatory Visit: Payer: BC Managed Care – PPO | Admitting: Family Medicine

## 2018-07-14 ENCOUNTER — Ambulatory Visit (INDEPENDENT_AMBULATORY_CARE_PROVIDER_SITE_OTHER): Payer: BC Managed Care – PPO

## 2018-07-14 VITALS — BP 141/88 | HR 82 | Temp 98.0°F | Resp 16 | Ht 72.0 in | Wt 193.0 lb

## 2018-07-14 DIAGNOSIS — R05 Cough: Secondary | ICD-10-CM

## 2018-07-14 DIAGNOSIS — R059 Cough, unspecified: Secondary | ICD-10-CM

## 2018-07-14 DIAGNOSIS — R062 Wheezing: Secondary | ICD-10-CM

## 2018-07-14 MED ORDER — FLUTICASONE PROPIONATE 50 MCG/ACT NA SUSP: 2.0000 | Freq: Every day | NASAL | 6 refills | Status: DC

## 2018-07-14 MED ORDER — ALBUTEROL SULFATE HFA 108 (90 BASE) MCG/ACT IN AERS: 2.0000 | INHALATION_SPRAY | Freq: Four times a day (QID) | RESPIRATORY_TRACT | 0 refills | Status: DC | PRN

## 2018-07-14 NOTE — Progress Notes (Signed)
Established Patient Office Visit  Subjective:  Patient ID: Manuel Johnson, male    DOB: 01/28/1971  Age: 48 y.o. MRN: [id number]  CC:  Chief Complaint  Patient presents with  . Cough    pt states he had the flu last week and his chest is is pain when he coughs/ yellow mucus, x 1wk    HPI Manuel Johnson presents for   Cough and a crackling sensation in his chest Cough is productive of CLEAR SPUTUM He is wheezing more at night He reports that he diagnosed himself with the flu because everyone around him had the flu He reports that he has some fatigue   He denies nausea or vomiting He had some diarrhea  He has been taking otc nyquil/dayquil He reports that the fevers resolved  He did not get the seasonal flu shot   Past Medical History:  Diagnosis Date  . Insomnia 2015  . S/P LASIK surgery   . Seasonal allergies     Past Surgical History:  Procedure Laterality Date  . LASIK  2006    Family History  Problem Relation Age of Onset  . Alzheimer's disease Mother   . Heart disease Mother        angina  . Other Mother        died at 47yo from MVA  . Alcohol abuse Father   . Cancer Neg Hx   . Stroke Neg Hx   . Hypertension Neg Hx   . Diabetes Neg Hx     Social History   Socioeconomic History  . Marital status: Married    Spouse name: Not on file  . Number of children: Not on file  . Years of education: Not on file  . Highest education level: Not on file  Occupational History  . Occupation: principal    Employer: [employer]    Comment: [employer]  Social Needs  . Financial resource strain: Not on file  . Food insecurity:    Worry: Not on file    Inability: Not on file  . Transportation needs:    Medical: Not on file    Non-medical: Not on file  Tobacco Use  . Smoking status: Former Smoker    Packs/day: 1.00    Years: 5.00    Pack years: 5.00    Last attempt to quit: 10/10/2010    Years since quitting: 7.7  . Smokeless  tobacco: Never Used  Substance and Sexual Activity  . Alcohol use: Yes    Alcohol/week: 6.0 standard drinks    Types: 6 Cans of beer per week    Comment: 6 drinks per week (weekends)  . Drug use: No  . Sexual activity: Yes    Birth control/protection: None  Lifestyle  . Physical activity:    Days per week: Not on file    Minutes per session: Not on file  . Stress: Not on file  Relationships  . Social connections:    Talks on phone: Not on file    Gets together: Not on file    Attends religious service: Not on file    Active member of club or organization: Not on file    Attends meetings of clubs or organizations: Not on file    Relationship status: Not on file  . Intimate partner violence:    Fear of current or ex partner: Not on file    Emotionally abused: Not on file    Physically abused: Not on file  Forced sexual activity: Not on file  Other Topics Concern  . Not on file  Social History Narrative   [place] [facility]  Grew up in [place], [place] (Is a [employer] supporter). 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  Barbette OrFinn 8yo, [name] 47yo.  Exercise daily, 6mi daily with fit bit, running, weights.  08/2016   He enjoys having a few pints of beer on the weekends with friends while watching soccer matches.  He does not drink much or often.    Outpatient Medications Prior to Visit  Medication Sig Dispense Refill  . azithromycin (ZITHROMAX) 250 MG tablet Take 2 pills by mouth on day 1, then 1 pill by mouth per day on days 2 through 5. (Patient not taking: Reported on 07/14/2018) 6 tablet 0  . benzonatate (TESSALON) 100 MG capsule Take 1 capsule (100 mg total) by mouth 3 (three) times daily as needed for cough. (Patient not taking: Reported on 07/14/2018) 20 capsule 0  . HYDROcodone-homatropine (HYCODAN) 5-1.5 MG/5ML syrup 827m by mouth a bedtime as needed for cough. (Patient not taking: Reported on 07/14/2018) 120 mL 0  . ipratropium (ATROVENT) 0.03 %  nasal spray PLACE 2 SPRAYS INTO BOTH NOSTRILS 2 (TWO) TIMES DAILY. (Patient not taking: Reported on 07/14/2018) 30 mL 0   No facility-administered medications prior to visit.     Allergies  Allergen Reactions  . Prednisone Hives    ROS Review of Systems See hpi    Objective:    Physical Exam  BP (!) 141/88   Pulse 82   Temp 98 F (36.7 C) (Oral)   Resp 16   Ht 6' (1.829 m)   Wt 193 lb (87.5 kg)   SpO2 96%   BMI 26.18 kg/m  Wt Readings from Last 3 Encounters:  07/14/18 193 lb (87.5 kg)  03/21/18 198 lb (89.8 kg)  10/22/17 194 lb 12.8 oz (88.4 kg)   General: alert, oriented, in NAD Head: normocephalic, atraumatic, no sinus tenderness Eyes: EOM intact, no scleral icterus or conjunctival injection Ears: TM clear bilaterally Nose: mucosa nonerythematous, nonedematous Throat: no pharyngeal exudate or erythema Lymph: no posterior auricular, submental or cervical lymph adenopathy Heart: normal rate, normal sinus rhythm, no murmurs Lungs: diffuse wheezing    Health Maintenance Due  Topic Date Due  . HIV Screening  06/04/1986  . INFLUENZA VACCINE  01/02/2018    There are no preventive care reminders to display for this patient.  Lab Results  Component Value Date   TSH 1.880 10/22/2017   Lab Results  Component Value Date   WBC 6.4 10/22/2017   HGB 14.9 10/22/2017   HCT 45.6 10/22/2017   MCV 90 10/22/2017   PLT 404 10/22/2017   Lab Results  Component Value Date   NA 140 10/22/2017   K 5.0 10/22/2017   CO2 24 10/22/2017   GLUCOSE 93 10/22/2017   BUN 14 10/22/2017   CREATININE 1.01 10/22/2017   BILITOT 0.7 08/29/2016   ALKPHOS 47 08/29/2016   AST 25 08/29/2016   ALT 26 08/29/2016   PROT 7.1 08/29/2016   ALBUMIN 4.5 08/29/2016   CALCIUM 10.2 10/22/2017   Lab Results  Component Value Date   CHOL 219 (H) 08/29/2016   Lab Results  Component Value Date   HDL 78 08/29/2016   Lab Results  Component Value Date   LDLCALC 117 (H) 08/29/2016   Lab  Results  Component Value Date   TRIG 122 08/29/2016   Lab Results  Component Value Date   CHOLHDL 2.8 08/29/2016   Lab Results  Component Value Date   HGBA1C 5.3 08/29/2016      Assessment & Plan:   Problem List Items Addressed This Visit    None    Visit Diagnoses    Cough    -  Primary   Relevant Medications   albuterol (PROVENTIL HFA;VENTOLIN HFA) 108 (90 Base) MCG/ACT inhaler   Other Relevant Orders   DG Chest 2 View (Completed)   Wheezing       Relevant Medications   albuterol (PROVENTIL HFA;VENTOLIN HFA) 108 (90 Base) MCG/ACT inhaler   Other Relevant Orders   DG Chest 2 View (Completed)     Would advise albuterol to help with symptoms Continue prn meds   Meds ordered this encounter  Medications  . albuterol (PROVENTIL HFA;VENTOLIN HFA) 108 (90 Base) MCG/ACT inhaler    Sig: Inhale 2 puffs into the lungs every 6 (six) hours as needed for wheezing or shortness of breath.    Dispense:  1 Inhaler    Refill:  0  . fluticasone (FLONASE) 50 MCG/ACT nasal spray    Sig: Place 2 sprays into both nostrils daily.    Dispense:  16 g    Refill:  6    Follow-up: Return if symptoms worsen or fail to improve.    Doristine Bosworth, MD

## 2018-07-14 NOTE — Patient Instructions (Addendum)
You do not have pneumonia or bronchitis.  The wheezing could be from the inflammation in the airway. Please try albuterol and follow up with your PCP.    CLINICAL DATA:  Productive cough.  Wheezing.  EXAM: CHEST - 2 VIEW  COMPARISON:  August 29, 2015  FINDINGS: The heart size and mediastinal contours are within normal limits. Both lungs are clear. The visualized skeletal structures are unremarkable.  IMPRESSION: No active cardiopulmonary disease.   Electronically Signed   By: Gerome Sam III M.D   On: 07/14/2018 11:41   If you have lab work done today you will be contacted with your lab results within the next 2 weeks.  If you have not heard from [place] then please contact us. The fastest way to get your results is to register for My Chart.   IF you received an x-ray today, you will receive an invoice from [facility]. Please contact [facility] at [phone] with questions or concerns regarding your invoice.   IF you received labwork today, you will receive an invoice from [place]. Please contact [facility] at [phone] with questions or concerns regarding your invoice.   Our billing staff will not be able to assist you with questions regarding bills from these companies.  You will be contacted with the lab results as soon as they are available. The fastest way to get your results is to activate your My Chart account. Instructions are located on the last page of this paperwork. If you have not heard from [place] regarding the results in 2 weeks, please contact this office.

## 2018-08-04 ENCOUNTER — Other Ambulatory Visit: Payer: Self-pay | Admitting: Family Medicine

## 2018-08-04 DIAGNOSIS — R062 Wheezing: Secondary | ICD-10-CM

## 2018-08-04 DIAGNOSIS — R05 Cough: Secondary | ICD-10-CM

## 2018-08-04 DIAGNOSIS — R059 Cough, unspecified: Secondary | ICD-10-CM

## 2018-08-04 NOTE — Telephone Encounter (Signed)
Patient called, left VM to return call to the office to schedule an OV in order to receive refills. 

## 2019-03-06 ENCOUNTER — Ambulatory Visit: Payer: BC Managed Care – PPO | Admitting: Medical

## 2019-03-06 ENCOUNTER — Other Ambulatory Visit: Payer: Self-pay

## 2019-03-06 ENCOUNTER — Encounter: Payer: Self-pay | Admitting: Medical

## 2019-03-06 VITALS — BP 138/88 | HR 72 | Temp 97.6°F | Ht 72.0 in | Wt 208.4 lb

## 2019-03-06 DIAGNOSIS — G4700 Insomnia, unspecified: Secondary | ICD-10-CM | POA: Diagnosis not present

## 2019-03-06 DIAGNOSIS — I8391 Asymptomatic varicose veins of right lower extremity: Secondary | ICD-10-CM | POA: Diagnosis not present

## 2019-03-06 DIAGNOSIS — Z23 Encounter for immunization: Secondary | ICD-10-CM | POA: Diagnosis not present

## 2019-03-06 DIAGNOSIS — Z0000 Encounter for general adult medical examination without abnormal findings: Secondary | ICD-10-CM | POA: Diagnosis not present

## 2019-03-06 MED ORDER — ESZOPICLONE 2 MG PO TABS: 2.0000 mg | ORAL_TABLET | Freq: Every evening | ORAL | 0 refills | Status: DC | PRN

## 2019-03-06 NOTE — Progress Notes (Signed)
Subjective:   HPI  Manuel MiyamotoGerald Johnson is a 48 y.o. male who presents for Chief Complaint  Patient presents with  . Annual Exam  . Insomnia    Patient Care Team: Emanuelle Hammerstrom, Kermit Baloavid S, PA-C as PCP - General (Family Medicine) Sees dentist Sees eye doctor  Concerns: He continues to have issues with sleep.  Can only get to sleep okay but wakes up a couple times per night.  He is not a worrier does not feel this is anxiety related.  He is careful not to drink late into the evening with fluids or caffeine.  He does not want to take Xanax or an antidepressant as he is not depressed.  He would like to use some other sleep aid.  Benadryl makes him restless.  He does use CBD supplements sometimes to help with sleep.  Other than sleep he says he feels great, active, exercising, eating healthy.  Reviewed their medical, surgical, family, social, medication, and allergy history and updated chart as appropriate.  Past Medical History:  Diagnosis Date  . Insomnia 2015  . S/P LASIK surgery   . Seasonal allergies     Past Surgical History:  Procedure Laterality Date  . LASIK  2006    Social History   Socioeconomic History  . Marital status: Married    Spouse name: Not on file  . Number of children: Not on file  . Years of education: Not on file  . Highest education level: Not on file  Occupational History  . Occupation: principal    Employer: [employer]    Comment: [employer]  Social Needs  . Financial resource strain: Not on file  . Food insecurity    Worry: Not on file    Inability: Not on file  . Transportation needs    Medical: Not on file    Non-medical: Not on file  Tobacco Use  . Smoking status: Former Smoker    Packs/day: 1.00    Years: 5.00    Pack years: 5.00    Quit date: 10/10/2010    Years since quitting: 8.4  . Smokeless tobacco: Never Used  Substance and Sexual Activity  . Alcohol use: Yes    Alcohol/week: 6.0 standard drinks    Types: 6  Cans of beer per week    Comment: 6 drinks per week (weekends)  . Drug use: No  . Sexual activity: Yes    Birth control/protection: None  Lifestyle  . Physical activity    Days per week: Not on file    Minutes per session: Not on file  . Stress: Not on file  Relationships  . Social Musicianconnections    Talks on phone: Not on file    Gets together: Not on file    Attends religious service: Not on file    Active member of club or organization: Not on file    Attends meetings of clubs or organizations: Not on file    Relationship status: Not on file  . Intimate partner violence    Fear of current or ex partner: Not on file    Emotionally abused: Not on file    Physically abused: Not on file    Forced sexual activity: Not on file  Other Topics Concern  . Not on file  Social History Narrative   [place] [facility]  [place], [place] (Is a [employer] supporter). 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  Irving CopasFinn and [employer]el[employer]  47yo.  He enjoys having a few pints of beer on the weekends with friends while watching soccer matches.    03/2019    Family History  Problem Relation Age of Onset  . Alzheimer's disease Mother   . Heart disease Mother        angina  . Other Mother        died at 57yo from [place]  . Alcohol abuse Father   . Cancer Neg Hx   . Stroke Neg Hx   . Hypertension Neg Hx   . Diabetes Neg Hx      Current Outpatient Medications:  .  fluticasone (FLONASE) 50 MCG/ACT nasal spray, Place 2 sprays into both nostrils daily., Disp: 16 g, Rfl: 6 .  eszopiclone (LUNESTA) 2 MG TABS tablet, Take 1 tablet (2 mg total) by mouth at bedtime as needed for sleep. Take immediately before bedtime, Disp: 30 tablet, Rfl: 0  Allergies  Allergen Reactions  . Prednisone Hives       Review of Systems Constitutional: -fever, -chills, -sweats, -unexpected weight change, -decreased appetite, -fatigue Allergy: -sneezing, -itching, -congestion Dermatology:  -changing moles, --rash, -lumps ENT: -runny nose, -ear pain, -sore throat, -hoarseness, -sinus pain, -teeth pain, - ringing in ears, -hearing loss, -nosebleeds Cardiology: -chest pain, -palpitations, -swelling, -difficulty breathing when lying flat, -waking up short of breath Respiratory: -cough, -shortness of breath, -difficulty breathing with exercise or exertion, -wheezing, -coughing up blood Gastroenterology: -abdominal pain, -nausea, -vomiting, -diarrhea, -constipation, -blood in stool, -changes in bowel movement, -difficulty swallowing or eating Hematology: -bleeding, -bruising  Musculoskeletal: -joint aches, -muscle aches, -joint swelling, -back pain, -neck pain, -cramping, -changes in gait Ophthalmology: denies vision changes, eye redness, itching, discharge Urology: -burning with urination, -difficulty urinating, -blood in urine, -urinary frequency, -urgency, -incontinence Neurology: -headache, -weakness, -tingling, -numbness, -memory loss, -falls, -dizziness Psychology: -depressed mood, -agitation, +sleep problems Male GU: no testicular mass, pain, no lymph nodes swollen, no swelling, no rash.     Objective:  BP 138/88   Pulse 72   Temp 97.6 F (36.4 C)   Ht 6' (1.829 m)   Wt 208 lb 6.4 oz (94.5 kg)   SpO2 97%   BMI 28.26 kg/m   General appearance: alert, no distress, WD/WN, Caucasian male Skin: unremarkable HEENT: normocephalic, conjunctiva/corneas normal, sclerae anicteric, PERRLA, EOMi, nares patent, no discharge or erythema, pharynx normal Oral cavity: MMM, tongue normal, teeth normal Neck: supple, no lymphadenopathy, no thyromegaly, no masses, normal ROM, no bruits Chest: non tender, normal shape and expansion Heart: RRR, normal S1, S2, no murmurs Lungs: CTA bilaterally, no wheezes, rhonchi, or rales Abdomen: +bs, soft, non tender, non distended, no masses, no hepatomegaly, no splenomegaly, no bruits Back: non tender, normal ROM, no scoliosis Musculoskeletal: upper  extremities non tender, no obvious deformity, normal ROM throughout, lower extremities non tender, no obvious deformity, normal ROM throughout Extremities: no edema, no cyanosis, no clubbing Pulses: 2+ symmetric, upper and lower extremities, normal cap refill Neurological: alert, oriented x 3, CN2-12 intact, strength normal upper extremities and lower extremities, sensation normal throughout, DTRs 2+ throughout, no cerebellar signs, gait normal Psychiatric: normal affect, behavior normal, pleasant  GU: normal male external genitalia,uncircumcised, nontender, no masses, no hernia, no lymphadenopathy Rectal: deferred  Assessment and Plan :   Encounter Diagnoses  Name Primary?  . Encounter for health maintenance examination in adult Yes  . Need for influenza vaccination   . Varicose veins of right lower extremity, unspecified whether complicated   . Insomnia, unspecified type  Physical exam - discussed and counseled on healthy lifestyle, diet, exercise, preventative care, vaccinations, sick and well care, proper use of emergency dept and after hours care, and addressed their concerns.    Health screening: See your eye doctor yearly for routine vision care. See your dentist yearly for routine dental care including hygiene visits twice yearly.  Cancer screening Advised monthly self testicular exam  Colonoscopy:  Advised he call insurance about screening prior to age 63yo given new guidelines  Discussed PSA, prostate exam, and prostate cancer screening risks/benefits.   Plan baseline age 46yo.  Vaccinations: Advised yearly influenza vaccine Counseled on the influenza virus vaccine.  Vaccine information sheet given.  Influenza vaccine given after consent obtained.  He is up to date on Td  Separate significant chronic issues discussed: Discussed insomnia, prior trials of sleep aids and what he wants to try.  We discussed other sleep hygiene measures.  Begin trial of Lunesta.   Referral for SNAP home sleep study  Varicose veins - consider vein clinic consult.  He will continue compression stockings.   Manuel Johnson was seen today for annual exam and insomnia.  Diagnoses and all orders for this visit:  Encounter for health maintenance examination in adult -     Basic metabolic panel -     CBC with Differential/Platelet  Need for influenza vaccination -     Flu Vaccine QUAD 6+ mos PF IM (Fluarix Quad PF)  Varicose veins of right lower extremity, unspecified whether complicated  Insomnia, unspecified type  Other orders -     eszopiclone (LUNESTA) 2 MG TABS tablet; Take 1 tablet (2 mg total) by mouth at bedtime as needed for sleep. Take immediately before bedtime  Follow-up pending labs, yearly for physical

## 2019-03-07 LAB — BASIC METABOLIC PANEL
BUN/Creatinine Ratio: 13 (ref 9–20)
BUN: 14 mg/dL (ref 6–24)
CO2: 25 mmol/L (ref 20–29)
Calcium: 10 mg/dL (ref 8.7–10.2)
Chloride: 101 mmol/L (ref 96–106)
Creatinine, Ser: 1.07 mg/dL (ref 0.76–1.27)
GFR calc Af Amer: 95 mL/min/{1.73_m2} (ref >59)
GFR calc non Af Amer: 82 mL/min/{1.73_m2} (ref >59)
Glucose: 90 mg/dL (ref 65–99)
Potassium: 4.3 mmol/L (ref 3.5–5.2)
Sodium: 139 mmol/L (ref 134–144)

## 2019-03-07 LAB — CBC WITH DIFFERENTIAL/PLATELET
Basophils Absolute: 0.1 10*3/uL (ref 0.0–0.2)
Basos: 1 %
EOS (ABSOLUTE): 0.5 10*3/uL — ABNORMAL HIGH (ref 0.0–0.4)
Eos: 6 %
Hematocrit: 43.3 % (ref 37.5–51.0)
Hemoglobin: 15 g/dL (ref 13.0–17.7)
Immature Grans (Abs): 0 10*3/uL (ref 0.0–0.1)
Immature Granulocytes: 0 %
Lymphocytes Absolute: 2.6 10*3/uL (ref 0.7–3.1)
Lymphs: 31 %
MCH: 30.2 pg (ref 26.6–33.0)
MCHC: 34.6 g/dL (ref 31.5–35.7)
MCV: 87 fL (ref 79–97)
Monocytes Absolute: 0.6 10*3/uL (ref 0.1–0.9)
Monocytes: 7 %
Neutrophils Absolute: 4.7 10*3/uL (ref 1.4–7.0)
Neutrophils: 55 %
Platelets: 382 10*3/uL (ref 150–450)
RBC: 4.97 x10E6/uL (ref 4.14–5.80)
RDW: 12.8 % (ref 11.6–15.4)
WBC: 8.4 10*3/uL (ref 3.4–10.8)

## 2019-05-22 ENCOUNTER — Other Ambulatory Visit: Payer: Self-pay | Admitting: Family Medicine

## 2019-05-22 NOTE — Telephone Encounter (Signed)
Forwarding medication refill request to the clinical pool for review. 

## 2019-08-01 ENCOUNTER — Ambulatory Visit: Payer: BC Managed Care – PPO

## 2019-08-13 ENCOUNTER — Other Ambulatory Visit: Payer: Self-pay

## 2019-08-13 ENCOUNTER — Encounter: Payer: Self-pay | Admitting: Family Medicine

## 2019-08-13 ENCOUNTER — Other Ambulatory Visit: Payer: Self-pay | Admitting: *Deleted

## 2019-08-13 ENCOUNTER — Ambulatory Visit: Payer: BC Managed Care – PPO | Admitting: Family Medicine

## 2019-08-13 VITALS — BP 134/88 | HR 88 | Temp 98.7°F | Ht 72.0 in | Wt 204.2 lb

## 2019-08-13 DIAGNOSIS — M5410 Radiculopathy, site unspecified: Secondary | ICD-10-CM | POA: Diagnosis not present

## 2019-08-13 DIAGNOSIS — M62838 Other muscle spasm: Secondary | ICD-10-CM

## 2019-08-13 MED ORDER — FLUTICASONE PROPIONATE 50 MCG/ACT NA SUSP: NASAL | 0 refills | Status: DC

## 2019-08-13 MED ORDER — MELOXICAM 15 MG PO TABS: 15.0000 mg | ORAL_TABLET | Freq: Every day | ORAL | 0 refills | Status: DC

## 2019-08-13 MED ORDER — METAXALONE 800 MG PO TABS: 400.0000 mg | ORAL_TABLET | Freq: Three times a day (TID) | ORAL | 0 refills | Status: DC | PRN

## 2019-08-13 MED ORDER — TRAMADOL HCL 50 MG PO TABS: 50.0000 mg | ORAL_TABLET | Freq: Three times a day (TID) | ORAL | 0 refills | Status: AC | PRN

## 2019-08-13 NOTE — Progress Notes (Signed)
Chief Complaint  Patient presents with  . Shoulder Pain    left shoulder pain x 3 weeks, no injury. Has tried IBU and used Mg rub and heating pad. Used [employer]. Very little relief from all of these things.and the pain is constant.    Patient presents with complaint of 3 weeks of pain at his posterior L shoulder, which is now radiating down the left arm, to the level of the elbow.  Over the course of the 3 weeks, it has gotten worse.  It started in the shoulder blade only. Arm feels like it is being "pulled" constantly. Pain in the shoulder is constant, like an elbow is pressing in on him. Affecting his sleep.  Denies any neck pain.  No numbness or tingling or weakness in the arm  Took ibuprofen 800mg  (4 OTC), which helped, but ran out and pain recurred about 5 hours later.  PMH, PSH, SH reviewed/updated Meds: Flonase, ibuprofen prn  Allergies  Allergen Reactions  . Prednisone Hives   ROS: no fever, chills, URI symptoms, headache, dizziness, numbness, tingling, chest pain, shortness of breath, weakness or other concerns except as noted in HPI   PHYSICAL EXAM:  BP 134/88   Pulse 88   Temp 98.7 F (37.1 C) (Other (Comment))   Ht 6' (1.829 m)   Wt 204 lb 3.2 oz (92.6 kg)   BMI 27.69 kg/m   Well-appearing, pleasant male, mildly uncomfortable HEENT: conjunctiva and sclera are clear, EOMI, wearing mask Neck: no c-spine tenderness, no muscle spasm, full ROM Back: Spine nontender, no CVA tenderness Area of palpable muscle spasm at L upper rhomboid muscles.  Palpable knot, tender to palpation.  No other muscle spasm LUE--nontender to palpation DTR's 2+ and symmetric Normal strength, sensation Skin: normal turgor, no rash  ASSESSMENT/PLAN:  Muscle spasm of left shoulder area - Plan: meloxicam (MOBIC) 15 MG tablet, metaxalone (SKELAXIN) 800 MG tablet, traMADol (ULTRAM) 50 MG tablet  Radiculopathy affecting upper extremity - Plan: meloxicam (MOBIC) 15 MG tablet, metaxalone  (SKELAXIN) 800 MG tablet, traMADol (ULTRAM) 50 MG tablet  Risks/SE of all meds reviewed. Encouraged heat, stretches (shown) To check into PT through trainers at school (if not, rec [facility], Aart Schulenklopper)  Contact if not tolerating treatments, or not improving. Cannot tolerate prednisone.   Stop the ibuprofen. Instead take meloxicam 15 mg once daily with food. The only other pain medication you can take along with this is tylenol products (acetaminophen)--no aleve, advil, motrin, Goody/BC, etc Take it once daily for at least 7-10 days or until your pain has completely resolved. You may not need to finish the entire bottle.  You also are going to take a muscle relaxant up to 3 times daily if needed. Most people don't get sedated from it, but use it with caution at first during the day, and consider just 1/2 tablet if it makes you tired at at all. Lastly, we are prescribing tramadol--this is a pain medication.  You may take this at night or whenever your pain is severe.  You may need to use this the first couple of nights until the other medications are helping.  Physical therapy should also be helpful. Massage, heat, Tens unit or dry needling are type of treatments that might be helpful.

## 2019-08-13 NOTE — Patient Instructions (Addendum)
Stop the ibuprofen. Instead take meloxicam 15 mg once daily with food. The only other pain medication you can take along with this is tylenol products (acetaminophen), and/or the tramadol that was prescribed for bedtime.  Do not take aleve, advil, motrin, Goody/BC, etc Take meloxicam once daily for at least 7-10 days or until your pain has completely resolved. You may not need to finish the entire bottle.  You also are going to take a muscle relaxant (skelaxin/metaxalone) up to 3 times daily if needed. Most people don't get sedated from it, but use it with caution at first during the day, and consider just 1/2 tablet if it makes you tired at at all. Lastly, we are prescribing tramadol--this is a pain medication.  You may take this at night or whenever your pain is severe.  You may need to use this the first couple of nights until the other medications are helping.  Physical therapy should also be helpful. Massage, heat, Tens unit or dry needling are type of treatments that might be helpful. SalonPas with lidocaine, or Biofreeze or other topical medications may also be helpful.

## 2019-08-16 ENCOUNTER — Encounter: Payer: Self-pay | Admitting: Family Medicine

## 2019-08-17 ENCOUNTER — Encounter: Payer: Self-pay | Admitting: Family Medicine

## 2019-08-17 ENCOUNTER — Ambulatory Visit
Admission: RE | Admit: 2019-08-17 | Discharge: 2019-08-17 | Disposition: A | Discharge status: Home / Self Care | Payer: BC Managed Care – PPO | Source: Ambulatory Visit | Attending: Family Medicine | Admitting: Family Medicine

## 2019-08-17 ENCOUNTER — Other Ambulatory Visit: Payer: Self-pay

## 2019-08-17 ENCOUNTER — Ambulatory Visit: Payer: BC Managed Care – PPO | Admitting: Family Medicine

## 2019-08-17 VITALS — BP 140/100 | HR 80 | Temp 98.6°F | Ht 72.0 in | Wt 203.4 lb

## 2019-08-17 DIAGNOSIS — M5412 Radiculopathy, cervical region: Secondary | ICD-10-CM

## 2019-08-17 MED ORDER — HYDROCODONE-ACETAMINOPHEN 5-325 MG PO TABS: 1.0000 | ORAL_TABLET | Freq: Four times a day (QID) | ORAL | 0 refills | Status: DC | PRN

## 2019-08-17 MED ORDER — METHYLPREDNISOLONE ACETATE 80 MG/ML IJ SUSP
80.0000 mg | Freq: Once | INTRAMUSCULAR | Status: AC
  Administered 2019-08-17: 80 mg via INTRAMUSCULAR

## 2019-08-17 MED ORDER — GABAPENTIN 300 MG PO CAPS: ORAL_CAPSULE | ORAL | 0 refills | Status: DC

## 2019-08-17 NOTE — Patient Instructions (Addendum)
Go to [facility] for your neck x-ray ([address]).  Change from tramadol to hydrocodone, use at bedtime.  If needed during the day, be aware of likelihood of sedation. It doesn't seem like tramadol helps much, so stop it.  Start taking gabapentin 300mg  at bedtime.  After a couple of days, if not too sedating, you may increase to twice daily.  If it makes you too drowsy during the day, we may need to start a lower dose (100mg ) for the daytime until we can work up slowly to the higher dose.  If it doesn't make you drowsy at all, you can then after a few more days, start taking three times daily. This helps more with nerve pain, but takes time to become effective.  Please give me an update in a few days (sooner if Dr. suggests different information). We will be in touch later with your x-ray results (should have them by the end of the day, unless you get it late afternoon, then it might be tomorrow). You don't need appointment for the x-ray, just go before 4:30.  Next steps include PT, and likely an MRI as well.

## 2019-08-17 NOTE — Progress Notes (Signed)
Chief Complaint  Patient presents with  . Follow-up    on left shoulder, has worsened.    Patient presents for follow-up.  He was seen on Thursday, with palpable muscle spasm in upper rhomboid area, and some radicular symptoms.  Due to prednisone allergy, we elected to treat with NSAID (meloxicam) and muscle relaxants. He had the trainer at school work on the trigger point, which improved, but since then he reports increasing numbness and weakness into the L 4th and 5th fingers. Feels weak in his hand, had trouble gripping his door.  Pain continues to radiate down the inner arm. It isn't tender to touch, but he has a lot of arm pain, hard to get comfortable. It hurts to lay on his back, hurts to lay on his elbow.  The tramadol hasn't helped much with his pain.  Took twice on Friday when pain was unbearable.  He took one this morning--helps some with the back, not with the arm.  PMH, PSH, SH reviewed  Outpatient Encounter Medications as of 08/17/2019  Medication Sig Note  . fluticasone (FLONASE) 50 MCG/ACT nasal spray SPRAY 2 SPRAYS INTO EACH NOSTRIL EVERY DAY   . ibuprofen (ADVIL) 200 MG tablet Take 800 mg by mouth every 6 (six) hours as needed. 08/17/2019: Last dose 5am  . meloxicam (MOBIC) 15 MG tablet Take 1 tablet (15 mg total) by mouth daily. Take with food, daily until your pain has completely resolved   . metaxalone (SKELAXIN) 800 MG tablet Take 0.5-1 tablets (400-800 mg total) by mouth 3 (three) times daily as needed for muscle spasms.   . traMADol (ULTRAM) 50 MG tablet Take 1 tablet (50 mg total) by mouth every 8 (eight) hours as needed for up to 5 days for severe pain.   Marland Kitchen gabapentin (NEURONTIN) 300 MG capsule Start taking 1 tablet by mouth at bedtime.  Gradually titrate up to twice then three times daily if tolerate.   Marland Kitchen HYDROcodone-acetaminophen (NORCO) 5-325 MG tablet Take 1-2 tablets by mouth every 6 (six) hours as needed for severe pain.   . [EXPIRED] methylPREDNISolone acetate  (DEPO-MEDROL) injection 80 mg     No facility-administered encounter medications on file as of 08/17/2019.   NOT taking hydrocodone or gabapentin prior to today's visit. Did take meloxicam and tramadol.  Allergies  Allergen Reactions  . Prednisone Hives    ROS: no fever, chills, URI symptoms GI complaints, chest pain, rash, shortness of breath.  Pain in shoulder/upper back, radiating down arm, with his 4th and 5th fingers feeling "different" and weak.   PHYSICAL EXAM:  BP (!) 140/100   Pulse 80   Temp 98.6 F (37 C) (Tympanic)   Ht 6' (1.829 m)   Wt 203 lb 6.4 oz (92.3 kg)   BMI 27.59 kg/m   Pleasant male, in moderate discomfort. HEENT: conjunctiva and sclera are clear, EOMI. Wearing mask. Neck: no spinal tenderness or muscle spasm. Very slight asymmetry in tightness of the rhomboids, but no longer is tender nor has any discernable spasm or knot. He is noted to have weakness in his 4th and 5th fingers (with pincer grip testing with thumb).  Subjective decreased sensation in those fingers also. He is nontender at the ulnar nerve and elbow (but reports a lot of pain, in that area, just not reproducible on exam). Normal mood, affect, hygiene and grooming   ASSESSMENT/PLAN:  Cervical radiculopathy at C8 - Depo Medrol, due to allergy, will monitor in office to ensure no adverse reaction. C-spine films  today.  - Plan: gabapentin (NEURONTIN) 300 MG capsule, HYDROcodone-acetaminophen (NORCO) 5-325 MG tablet, DG Cervical Spine Complete, methylPREDNISolone acetate (DEPO-MEDROL) injection 80 mg  If no significant improvement in weakness and pain in the next few days with change in meds and PT, will try and get MRI authorized.  depomedrol 80mg  IM given, observed for minimum 15 mins in office Continue muscle relaxants--can change to prn, since not having active spasm. Can stop the mobic for a couple of days, restart prn for pain (not benefiting in addition to steroids today). PT  recommended (either through school or [facility]); discussed chiro vs PT. Cervical spine series today (may be needed to get MRI approval as next step) MRI if not improving  Change from tramadol to hydrocodone. Tramadol is ineffective. Risks/SE reviewed.  Start taking gabapentin 300mg  at bedtime, titrate up to BID and TID if tolerable and needed for pain relief.  Risks/SE reviewed.  Has appt with Dr. this afternoon (team doc at school--discussed that he is ortho and not likely the provider who would provide additional treatment, if needed, but his opinion is valuable).

## 2019-08-24 ENCOUNTER — Encounter: Payer: Self-pay | Admitting: Family Medicine

## 2019-08-24 ENCOUNTER — Other Ambulatory Visit: Payer: Self-pay | Admitting: Family Medicine

## 2019-08-24 DIAGNOSIS — M5412 Radiculopathy, cervical region: Secondary | ICD-10-CM

## 2019-08-25 ENCOUNTER — Telehealth: Payer: Self-pay | Admitting: Medical

## 2019-08-25 ENCOUNTER — Other Ambulatory Visit: Payer: Self-pay

## 2019-08-25 ENCOUNTER — Encounter: Payer: Self-pay | Admitting: Medical

## 2019-08-25 ENCOUNTER — Ambulatory Visit: Payer: BC Managed Care – PPO | Admitting: Medical

## 2019-08-25 VITALS — BP 134/88 | HR 87 | Temp 97.8°F | Ht 72.0 in | Wt 199.4 lb

## 2019-08-25 DIAGNOSIS — R29898 Other symptoms and signs involving the musculoskeletal system: Secondary | ICD-10-CM

## 2019-08-25 DIAGNOSIS — M549 Dorsalgia, unspecified: Secondary | ICD-10-CM | POA: Insufficient documentation

## 2019-08-25 DIAGNOSIS — M5412 Radiculopathy, cervical region: Secondary | ICD-10-CM | POA: Insufficient documentation

## 2019-08-25 MED ORDER — HYDROCODONE-ACETAMINOPHEN 5-325 MG PO TABS: 1.0000 | ORAL_TABLET | Freq: Four times a day (QID) | ORAL | 0 refills | Status: DC | PRN

## 2019-08-25 NOTE — Progress Notes (Signed)
Subjective: Chief Complaint  Patient presents with  . Follow-up    neck pain, back pain    Here today for follow-up, requesting additional medication to help with pain.  He was seen prior here with Dr. Lynelle Doctor for pain in the upper back radiating down left arm including weakness in the fingers.  On his last visit on 08/17/2019, he also ended up seeing [facility] the same day with Dr. Rennis Chris.  He has had 3 recent visits for the same between Dr. Lynelle Doctor and Dr. Rennis Chris.  He notes at this point he is taking a prednisone 21 tablet taper, received an intramuscular steroid shot as well.  He was started on gabapentin which he is now titrated quickly up to 3 times a day.  He also used all the to the hydrocodone prescribed last visit.  He notes the pain is still significant, requesting refill on pain medicine and steroid.  He was diagnosed with cervical radiculopathy.  He has an MRI cervical spine scheduled in 2 days per orthopedics.  He notes very little improvement in symptoms.  No change in symptoms at this point.  The symptoms began somewhat abruptly or acutely approximately a month ago in the left shoulder rating down the left arm, including unable to squeeze the latch on his front door or rash on the left hand currently due to weakness which is new in the past month along with those symptoms  ROS as in subjective    Objective: BP 134/88   Pulse 87   Temp 97.8 F (36.6 C)   Ht 6' (1.829 m)   Wt 199 lb 6.4 oz (90.4 kg)   SpO2 97%   BMI 27.04 kg/m   Gen: wd, wn, nad Otherwise not examined    Assessment: Encounter Diagnoses  Name Primary?  Marland Kitchen Upper back pain on left side Yes  . Left cervical radiculopathy   . Cervical radiculopathy at C8   . Hand weakness      Plan:  We were able to obtain the orthopedic note from where he was seen on 08/17/1999 by Dr. Rennis Chris.  Their concern was left periscapular and interscapular and left upper extremity pain with underlying radiculopathy.  Suspected C7  or C8 radiculopathy.  Orthopedic notes showed he was scheduled for MRI cervical spine without contrast and referred to physical therapy which is supposed to start after physical therapy.  I reviewed the notes from the visits here at our office as well with Dr. Lynelle Doctor  I went ahead and refilled his medication today for hydrocodone.  He has completed 2 rounds of steroid already.  We discussed the risk of steroid use.  I will send word to orthopedics about his request for third round of steroid which I do not believe would be all that helpful at this point.  We discussed risk of GI bleed, risk of bone health long-term repeated use, other side effects including changes in appetite and sleep.  I advised that he needs to follow-up with orthopedics for additional refills of pain medication since they will take over at this point with evaluation and treatment.  He rapidly increased gabapentin titrating up to 3 times a day within the past week.     F/u pending MRI in 2 days.  F/u with orthopedics.  Rudolfo was seen today for follow-up.  Diagnoses and all orders for this visit:  Upper back pain on left side  Left cervical radiculopathy  Cervical radiculopathy at C8 Comments: Depo Medrol, due to allergy, will  monitor in office to ensure no adverse reaction. C-spine films today.  Orders: -     HYDROcodone-acetaminophen (NORCO) 5-325 MG tablet; Take 1-2 tablets by mouth every 6 (six) hours as needed for severe pain.  Hand weakness

## 2019-08-25 NOTE — Telephone Encounter (Signed)
Ortho has been informed.

## 2019-08-25 NOTE — Telephone Encounter (Signed)
Patient has appointment today and office notes has been requested.

## 2019-08-25 NOTE — Telephone Encounter (Signed)
Manuel Johnson - Dr. Lynelle Doctor sent him a message through my chart.  See if we can get him back in today for recheck  Generic-try and call over [facility] and get a copy of his office notes from his recent visit

## 2019-08-25 NOTE — Telephone Encounter (Signed)
Please call [facility], Dr. Rennis Chris.  Let them know I saw him today to refill Hydrocodone ,#20.   I will defer future pain medication request to ortho.  He has already went up to gabapentin 3 times daily.     He is asking for another round of steroid.   He has already had IM injection and completed steroid prednisone dose pak.  I didn't feel a third round would be helpful at this point.   He is also asking to add Ibuprofen due to the pain.  Before I did anything else, I wanted to see if Dr. Rennis Chris wanted to do a third steroid dose or ibuprofen 800mg ?    I did review ortho notes from 08/17/2019 visit.  Patient has MRI on this Thursday.

## 2019-08-25 NOTE — Telephone Encounter (Signed)
What did ortho say in response to prior message?  I was expecting feedback?

## 2019-08-25 NOTE — Telephone Encounter (Signed)
Did ortho give feedback on my question about steroids vs anti-inflammatories?   Patient wanted to know, but if they were going to contact him about this directly, that is ok too.  (if there is anything not clear about a message, please talk to me - I was awaiting feedback from them)

## 2019-08-26 NOTE — Telephone Encounter (Signed)
Ortho did not give any feedback as they I was told they would pass the message on to the doctor.

## 2019-09-07 ENCOUNTER — Ambulatory Visit (HOSPITAL_COMMUNITY): Payer: Self-pay | Admitting: Orthopedic Surgery

## 2019-09-07 ENCOUNTER — Inpatient Hospital Stay (HOSPITAL_COMMUNITY): Admission: RE | Admit: 2019-09-07 | Payer: BC Managed Care – PPO | Source: Ambulatory Visit

## 2019-09-07 ENCOUNTER — Telehealth: Payer: Self-pay | Admitting: Medical

## 2019-09-07 ENCOUNTER — Other Ambulatory Visit (HOSPITAL_COMMUNITY): Payer: Self-pay | Admitting: Orthopedic Surgery

## 2019-09-07 NOTE — Telephone Encounter (Signed)
Pt dropped off medical clearance form to be filled out put in genera folder pt needs by Wednesday. There is a number on there to be faxed to and pt also wants to be called when ready to be picked up

## 2019-09-08 ENCOUNTER — Other Ambulatory Visit: Payer: Self-pay

## 2019-09-08 ENCOUNTER — Other Ambulatory Visit (HOSPITAL_COMMUNITY)
Admission: RE | Admit: 2019-09-08 | Discharge: 2019-09-08 | Disposition: A | Discharge status: Home / Self Care | Payer: BC Managed Care – PPO | Source: Ambulatory Visit | Attending: Orthopedic Surgery | Admitting: Orthopedic Surgery

## 2019-09-08 ENCOUNTER — Encounter (HOSPITAL_COMMUNITY): Payer: Self-pay | Admitting: Orthopedic Surgery

## 2019-09-08 ENCOUNTER — Other Ambulatory Visit: Payer: Self-pay | Admitting: Medical

## 2019-09-08 DIAGNOSIS — Z01818 Encounter for other preprocedural examination: Secondary | ICD-10-CM

## 2019-09-08 DIAGNOSIS — Z20822 Contact with and (suspected) exposure to covid-19: Secondary | ICD-10-CM | POA: Insufficient documentation

## 2019-09-08 DIAGNOSIS — Z01812 Encounter for preprocedural laboratory examination: Secondary | ICD-10-CM | POA: Diagnosis present

## 2019-09-08 LAB — SARS CORONAVIRUS 2 (TAT 6-24 HRS): SARS Coronavirus 2: NEGATIVE

## 2019-09-08 MED ORDER — SODIUM BICARBONATE 4 % IV SOLN
Freq: Once | INTRAVENOUS | Status: DC
  Filled 2019-09-08: qty 50

## 2019-09-08 NOTE — Progress Notes (Signed)
Pt

## 2019-09-08 NOTE — Telephone Encounter (Signed)
I received a request for preop clearance  He is a healthy guy in general.  However we often will do a baseline set of labs before surgery to check blood count, electrolytes, blood clotting times.  This would be a reasonable step to take.  If agreeable having swing by for labs today or tomorrow that I will put in the computer.  I do not necessarily need to see him back since I just saw him and his last physical was in October

## 2019-09-08 NOTE — Telephone Encounter (Signed)
Patient stated his surgery is tomorrow and he won't be needing to get his labs done here.

## 2019-09-08 NOTE — Progress Notes (Signed)
Pt denies SOB, chest pain and being under the care of a cardiologist. Pt denies having an echo and cardiac cath. Pt denies having an EKG and chest x ray. Pt denies recent labs. Pt made aware to stop taking Aspirin (unless otherwise advised by surgeon), vitamins, fish oil and herbal medications. Do not take any NSAIDs ie: Ibuprofen, Advil, Naproxen (Aleve), Motrin, BC and Goody Powder. Pt reminded to quarantine. Pt verbalized understanding of all pre-op instructions.

## 2019-09-09 ENCOUNTER — Ambulatory Visit (HOSPITAL_COMMUNITY): Payer: BC Managed Care – PPO

## 2019-09-09 ENCOUNTER — Ambulatory Visit (HOSPITAL_COMMUNITY): Payer: BC Managed Care – PPO | Admitting: Anesthesiology

## 2019-09-09 ENCOUNTER — Encounter (HOSPITAL_COMMUNITY)
Admission: RE | Disposition: A | Discharge status: Home / Self Care | Payer: Self-pay | Source: Ambulatory Visit | Attending: Orthopedic Surgery

## 2019-09-09 ENCOUNTER — Ambulatory Visit (HOSPITAL_COMMUNITY)
Admission: RE | Admit: 2019-09-09 | Discharge: 2019-09-09 | Disposition: A | Discharge status: Home / Self Care | Payer: BC Managed Care – PPO | Source: Ambulatory Visit | Attending: Orthopedic Surgery | Admitting: Orthopedic Surgery

## 2019-09-09 ENCOUNTER — Encounter (HOSPITAL_COMMUNITY): Payer: Self-pay | Admitting: Orthopedic Surgery

## 2019-09-09 DIAGNOSIS — Z79891 Long term (current) use of opiate analgesic: Secondary | ICD-10-CM | POA: Diagnosis not present

## 2019-09-09 DIAGNOSIS — M5013 Cervical disc disorder with radiculopathy, cervicothoracic region: Secondary | ICD-10-CM | POA: Insufficient documentation

## 2019-09-09 DIAGNOSIS — Z419 Encounter for procedure for purposes other than remedying health state, unspecified: Secondary | ICD-10-CM

## 2019-09-09 DIAGNOSIS — Z87891 Personal history of nicotine dependence: Secondary | ICD-10-CM | POA: Diagnosis not present

## 2019-09-09 DIAGNOSIS — Z79899 Other long term (current) drug therapy: Secondary | ICD-10-CM | POA: Diagnosis not present

## 2019-09-09 HISTORY — DX: Other cervical disc degeneration, unspecified cervical region: M50.30

## 2019-09-09 HISTORY — DX: Other cervical disc displacement, unspecified cervical region: M50.20

## 2019-09-09 HISTORY — PX: ANTERIOR CERVICAL DECOMP/DISCECTOMY FUSION: SHX1161

## 2019-09-09 LAB — COMPREHENSIVE METABOLIC PANEL
ALT: 36 U/L (ref 0–44)
AST: 25 U/L (ref 15–41)
Albumin: 4.3 g/dL (ref 3.5–5.0)
Alkaline Phosphatase: 40 U/L (ref 38–126)
Anion gap: 12 (ref 5–15)
BUN: 15 mg/dL (ref 6–20)
CO2: 24 mmol/L (ref 22–32)
Calcium: 9.7 mg/dL (ref 8.9–10.3)
Chloride: 102 mmol/L (ref 98–111)
Creatinine, Ser: 0.93 mg/dL (ref 0.61–1.24)
GFR calc Af Amer: >60 mL/min (ref >60)
GFR calc non Af Amer: >60 mL/min (ref >60)
Glucose, Bld: 98 mg/dL (ref 70–99)
Potassium: 4.2 mmol/L (ref 3.5–5.1)
Sodium: 138 mmol/L (ref 135–145)
Total Bilirubin: 1.1 mg/dL (ref 0.3–1.2)
Total Protein: 7.4 g/dL (ref 6.5–8.1)

## 2019-09-09 LAB — CBC
HCT: 49.2 % (ref 39.0–52.0)
Hemoglobin: 16.6 g/dL (ref 13.0–17.0)
MCH: 30.3 pg (ref 26.0–34.0)
MCHC: 33.7 g/dL (ref 30.0–36.0)
MCV: 89.8 fL (ref 80.0–100.0)
Platelets: 363 10*3/uL (ref 150–400)
RBC: 5.48 MIL/uL (ref 4.22–5.81)
RDW: 13.4 % (ref 11.5–15.5)
WBC: 6.5 10*3/uL (ref 4.0–10.5)
nRBC: 0 % (ref 0.0–0.2)

## 2019-09-09 SURGERY — ANTERIOR CERVICAL DECOMPRESSION/DISCECTOMY FUSION 2 LEVELS
Anesthesia: General | Site: Spine Cervical

## 2019-09-09 MED ORDER — OXYCODONE HCL 5 MG/5ML PO SOLN: 5.0000 mg | Freq: Once | ORAL | Status: DC | PRN

## 2019-09-09 MED ORDER — MIDAZOLAM HCL 2 MG/2ML IJ SOLN
INTRAMUSCULAR | Status: DC | PRN
  Administered 2019-09-09: 2 mg via INTRAVENOUS

## 2019-09-09 MED ORDER — BUPIVACAINE-EPINEPHRINE 0.25% -1:200000 IJ SOLN
INTRAMUSCULAR | Status: DC | PRN
  Administered 2019-09-09: 8 mL

## 2019-09-09 MED ORDER — 0.9 % SODIUM CHLORIDE (POUR BTL) OPTIME
TOPICAL | Status: DC | PRN
  Administered 2019-09-09: 2000 mL

## 2019-09-09 MED ORDER — HEMOSTATIC AGENTS (NO CHARGE) OPTIME
TOPICAL | Status: DC | PRN
  Administered 2019-09-09: 1 via TOPICAL

## 2019-09-09 MED ORDER — LIDOCAINE 2% (20 MG/ML) 5 ML SYRINGE
INTRAMUSCULAR | Status: AC
  Filled 2019-09-09: qty 5

## 2019-09-09 MED ORDER — PROPOFOL 10 MG/ML IV BOLUS
INTRAVENOUS | Status: AC
  Filled 2019-09-09: qty 20

## 2019-09-09 MED ORDER — ACETAMINOPHEN 10 MG/ML IV SOLN
INTRAVENOUS | Status: AC
  Filled 2019-09-09: qty 100

## 2019-09-09 MED ORDER — ONDANSETRON HCL 4 MG/2ML IJ SOLN
INTRAMUSCULAR | Status: DC | PRN
  Administered 2019-09-09: 4 mg via INTRAVENOUS

## 2019-09-09 MED ORDER — LIDOCAINE 2% (20 MG/ML) 5 ML SYRINGE
INTRAMUSCULAR | Status: DC | PRN
  Administered 2019-09-09: 80 mg via INTRAVENOUS

## 2019-09-09 MED ORDER — OXYCODONE-ACETAMINOPHEN 5-325 MG PO TABS
2.0000 | ORAL_TABLET | Freq: Four times a day (QID) | ORAL | Status: DC | PRN
  Administered 2019-09-09: 2 via ORAL

## 2019-09-09 MED ORDER — OXYCODONE-ACETAMINOPHEN 5-325 MG PO TABS
ORAL_TABLET | ORAL | Status: AC
  Filled 2019-09-09: qty 1

## 2019-09-09 MED ORDER — METHOCARBAMOL 500 MG PO TABS: 500.0000 mg | ORAL_TABLET | Freq: Three times a day (TID) | ORAL | 0 refills | Status: AC | PRN

## 2019-09-09 MED ORDER — CEFAZOLIN SODIUM-DEXTROSE 2-4 GM/100ML-% IV SOLN
INTRAVENOUS | Status: AC
  Filled 2019-09-09: qty 100

## 2019-09-09 MED ORDER — PROPOFOL 10 MG/ML IV BOLUS
INTRAVENOUS | Status: DC | PRN
  Administered 2019-09-09: 180 mg via INTRAVENOUS

## 2019-09-09 MED ORDER — TRANEXAMIC ACID-NACL 1000-0.7 MG/100ML-% IV SOLN
1000.0000 mg | INTRAVENOUS | Status: AC
  Administered 2019-09-09: 1000 mg via INTRAVENOUS
  Filled 2019-09-09: qty 100

## 2019-09-09 MED ORDER — ROCURONIUM BROMIDE 10 MG/ML (PF) SYRINGE
PREFILLED_SYRINGE | INTRAVENOUS | Status: AC
  Filled 2019-09-09: qty 10

## 2019-09-09 MED ORDER — LACTATED RINGERS IV SOLN: INTRAVENOUS | Status: DC

## 2019-09-09 MED ORDER — LACTATED RINGERS IV SOLN: INTRAVENOUS | Status: DC | PRN

## 2019-09-09 MED ORDER — ONDANSETRON HCL 4 MG/2ML IJ SOLN
INTRAMUSCULAR | Status: AC
  Filled 2019-09-09: qty 2

## 2019-09-09 MED ORDER — MIDAZOLAM HCL 2 MG/2ML IJ SOLN
INTRAMUSCULAR | Status: AC
  Filled 2019-09-09: qty 2

## 2019-09-09 MED ORDER — SUCCINYLCHOLINE CHLORIDE 200 MG/10ML IV SOSY
PREFILLED_SYRINGE | INTRAVENOUS | Status: AC
  Filled 2019-09-09: qty 10

## 2019-09-09 MED ORDER — THROMBIN (RECOMBINANT) 20000 UNITS EX SOLR
CUTANEOUS | Status: AC
  Filled 2019-09-09: qty 20000

## 2019-09-09 MED ORDER — EPINEPHRINE PF 1 MG/ML IJ SOLN
INTRAMUSCULAR | Status: AC
  Filled 2019-09-09: qty 1

## 2019-09-09 MED ORDER — METHOCARBAMOL 1000 MG/10ML IJ SOLN: 500.0000 mg | Freq: Four times a day (QID) | INTRAVENOUS | Status: DC | PRN

## 2019-09-09 MED ORDER — FENTANYL CITRATE (PF) 100 MCG/2ML IJ SOLN: 50.0000 ug | INTRAMUSCULAR | Status: DC | PRN

## 2019-09-09 MED ORDER — ROCURONIUM BROMIDE 10 MG/ML (PF) SYRINGE
PREFILLED_SYRINGE | INTRAVENOUS | Status: DC | PRN
  Administered 2019-09-09: 100 mg via INTRAVENOUS
  Administered 2019-09-09: 20 mg via INTRAVENOUS

## 2019-09-09 MED ORDER — FENTANYL CITRATE (PF) 250 MCG/5ML IJ SOLN
INTRAMUSCULAR | Status: DC | PRN
  Administered 2019-09-09: 150 ug via INTRAVENOUS

## 2019-09-09 MED ORDER — PROMETHAZINE HCL 25 MG/ML IJ SOLN: 6.2500 mg | INTRAMUSCULAR | Status: DC | PRN

## 2019-09-09 MED ORDER — HYDROMORPHONE HCL 1 MG/ML IJ SOLN: 0.2500 mg | INTRAMUSCULAR | Status: DC | PRN

## 2019-09-09 MED ORDER — MEPERIDINE HCL 25 MG/ML IJ SOLN: 6.2500 mg | INTRAMUSCULAR | Status: DC | PRN

## 2019-09-09 MED ORDER — DEXAMETHASONE SODIUM PHOSPHATE 10 MG/ML IJ SOLN
INTRAMUSCULAR | Status: DC | PRN
  Administered 2019-09-09: 10 mg via INTRAVENOUS

## 2019-09-09 MED ORDER — FENTANYL CITRATE (PF) 250 MCG/5ML IJ SOLN
INTRAMUSCULAR | Status: AC
  Filled 2019-09-09: qty 5

## 2019-09-09 MED ORDER — CEFAZOLIN SODIUM-DEXTROSE 2-4 GM/100ML-% IV SOLN
2.0000 g | INTRAVENOUS | Status: AC
  Administered 2019-09-09: 2 g via INTRAVENOUS

## 2019-09-09 MED ORDER — METHOCARBAMOL 500 MG PO TABS: 500.0000 mg | ORAL_TABLET | Freq: Four times a day (QID) | ORAL | Status: DC | PRN

## 2019-09-09 MED ORDER — OXYCODONE-ACETAMINOPHEN 10-325 MG PO TABS: 1.0000 | ORAL_TABLET | Freq: Four times a day (QID) | ORAL | 0 refills | Status: AC | PRN

## 2019-09-09 MED ORDER — ONDANSETRON HCL 4 MG PO TABS: 4.0000 mg | ORAL_TABLET | Freq: Three times a day (TID) | ORAL | 0 refills | Status: DC | PRN

## 2019-09-09 MED ORDER — OXYCODONE HCL 5 MG PO TABS: 5.0000 mg | ORAL_TABLET | Freq: Once | ORAL | Status: DC | PRN

## 2019-09-09 MED ORDER — ACETAMINOPHEN 10 MG/ML IV SOLN
INTRAVENOUS | Status: DC | PRN
  Administered 2019-09-09: 1000 mg via INTRAVENOUS

## 2019-09-09 MED ORDER — BUPIVACAINE HCL (PF) 0.25 % IJ SOLN
INTRAMUSCULAR | Status: AC
  Filled 2019-09-09: qty 30

## 2019-09-09 MED ORDER — OXYCODONE HCL 5 MG PO TABS
ORAL_TABLET | ORAL | Status: AC
  Filled 2019-09-09: qty 1

## 2019-09-09 MED ORDER — DEXAMETHASONE SODIUM PHOSPHATE 10 MG/ML IJ SOLN
INTRAMUSCULAR | Status: AC
  Filled 2019-09-09: qty 1

## 2019-09-09 MED ORDER — SUGAMMADEX SODIUM 200 MG/2ML IV SOLN
INTRAVENOUS | Status: DC | PRN
  Administered 2019-09-09: 200 mg via INTRAVENOUS

## 2019-09-09 MED ORDER — PHENYLEPHRINE HCL-NACL 10-0.9 MG/250ML-% IV SOLN
INTRAVENOUS | Status: DC | PRN
  Administered 2019-09-09: 20 ug/min via INTRAVENOUS

## 2019-09-09 MED ORDER — FENTANYL CITRATE (PF) 100 MCG/2ML IJ SOLN
INTRAMUSCULAR | Status: AC
  Administered 2019-09-09: 50 ug via INTRAVENOUS
  Filled 2019-09-09: qty 2

## 2019-09-09 SURGICAL SUPPLY — 77 items
AGENT HMST KT MTR STRL THRMB (HEMOSTASIS)
BIT DRILL NEURO 2X3.1 SFT TUCH (MISCELLANEOUS) IMPLANT
BLADE CLIPPER SURG (BLADE) IMPLANT
BONE VIVIGEN FORMABLE 1.3CC (Bone Implant) ×3 IMPLANT
BUR EGG ELITE 4.0 (BURR) IMPLANT
BUR EGG ELITE 4.0MM (BURR)
BUR MATCHSTICK NEURO 3.0 LAGG (BURR) IMPLANT
CABLE BIPOLOR RESECTION CORD (MISCELLANEOUS) ×3 IMPLANT
CANISTER SUCT 3000ML PPV (MISCELLANEOUS) ×3 IMPLANT
CLOSURE STERI-STRIP 1/2X4 (GAUZE/BANDAGES/DRESSINGS) ×1
CLSR STERI-STRIP ANTIMIC 1/2X4 (GAUZE/BANDAGES/DRESSINGS) ×2 IMPLANT
COVER MAYO STAND STRL (DRAPES) ×9 IMPLANT
COVER PLATE (Plate) ×2 IMPLANT
COVER SURGICAL LIGHT HANDLE (MISCELLANEOUS) ×6 IMPLANT
COVER WAND RF STERILE (DRAPES) ×3 IMPLANT
DEVICE FUSION NANLCK 8MM 6DEG (Cage) IMPLANT
DRAPE C-ARM 42X72 X-RAY (DRAPES) ×3 IMPLANT
DRAPE MICROSCOPE LEICA 46X105 (MISCELLANEOUS) IMPLANT
DRAPE POUCH INSTRU U-SHP 10X18 (DRAPES) ×3 IMPLANT
DRAPE SURG 17X23 STRL (DRAPES) ×3 IMPLANT
DRAPE U-SHAPE 47X51 STRL (DRAPES) ×3 IMPLANT
DRILL NEURO 2X3.1 SOFT TOUCH (MISCELLANEOUS)
DRSG OPSITE POSTOP 3X4 (GAUZE/BANDAGES/DRESSINGS) ×3 IMPLANT
DRSG OPSITE POSTOP 4X6 (GAUZE/BANDAGES/DRESSINGS) ×2 IMPLANT
DURAPREP 26ML APPLICATOR (WOUND CARE) ×3 IMPLANT
ELECT COATED BLADE 2.86 ST (ELECTRODE) ×3 IMPLANT
ELECT PENCIL ROCKER SW 15FT (MISCELLANEOUS) ×3 IMPLANT
ELECT REM PT RETURN 9FT ADLT (ELECTROSURGICAL) ×3
ELECTRODE REM PT RTRN 9FT ADLT (ELECTROSURGICAL) ×1 IMPLANT
FUSION TCS NANOLOCK 8MM 6DEG (Cage) ×3 IMPLANT
GLOVE BIO SURGEON STRL SZ 6.5 (GLOVE) ×2 IMPLANT
GLOVE BIO SURGEONS STRL SZ 6.5 (GLOVE) ×1
GLOVE BIOGEL PI IND STRL 6.5 (GLOVE) ×1 IMPLANT
GLOVE BIOGEL PI IND STRL 8.5 (GLOVE) ×1 IMPLANT
GLOVE BIOGEL PI INDICATOR 6.5 (GLOVE) ×2
GLOVE BIOGEL PI INDICATOR 8.5 (GLOVE) ×2
GLOVE SS BIOGEL STRL SZ 8.5 (GLOVE) ×1 IMPLANT
GLOVE SUPERSENSE BIOGEL SZ 8.5 (GLOVE) ×2
GLOVE SURG SS PI 7.0 STRL IVOR (GLOVE) ×2 IMPLANT
GOWN STRL REUS W/ TWL LRG LVL3 (GOWN DISPOSABLE) ×1 IMPLANT
GOWN STRL REUS W/TWL 2XL LVL3 (GOWN DISPOSABLE) ×3 IMPLANT
GOWN STRL REUS W/TWL LRG LVL3 (GOWN DISPOSABLE) ×3
GRAFT BNE MATRIX VG FRMBL SM 1 (Bone Implant) IMPLANT
KIT BASIN OR (CUSTOM PROCEDURE TRAY) ×3 IMPLANT
KIT TURNOVER KIT B (KITS) ×3 IMPLANT
MATRIX HEMOSTAT SURGIFLO (HEMOSTASIS) ×2 IMPLANT
NDL SPNL 18GX3.5 QUINCKE PK (NEEDLE) ×1 IMPLANT
NEEDLE HYPO 22GX1.5 SAFETY (NEEDLE) ×3 IMPLANT
NEEDLE SPNL 18GX3.5 QUINCKE PK (NEEDLE) ×3 IMPLANT
NS IRRIG 1000ML POUR BTL (IV SOLUTION) ×3 IMPLANT
PACK ORTHO CERVICAL (CUSTOM PROCEDURE TRAY) ×3 IMPLANT
PACK UNIVERSAL I (CUSTOM PROCEDURE TRAY) ×3 IMPLANT
PAD ARMBOARD 7.5X6 YLW CONV (MISCELLANEOUS) ×6 IMPLANT
PATTIES SURGICAL .25X.25 (GAUZE/BANDAGES/DRESSINGS) ×3 IMPLANT
PLATE LOCK ENDO TCS (Plate) ×1 IMPLANT
PLATE LOCK ENDO TCS F/COVER (Plate) IMPLANT
POSITIONER HEAD DONUT 9IN (MISCELLANEOUS) ×3 IMPLANT
RESTRAINT LIMB HOLDER UNIV (RESTRAINTS) ×3 IMPLANT
RUBBERBAND STERILE (MISCELLANEOUS) IMPLANT
SCREW ENDO BONE 3.8X14MM (Screw) ×4 IMPLANT
SPONGE INTESTINAL PEANUT (DISPOSABLE) ×3 IMPLANT
SPONGE LAP 4X18 RFD (DISPOSABLE) ×6 IMPLANT
SPONGE SURGIFOAM ABS GEL 100 (HEMOSTASIS) ×3 IMPLANT
SURGIFLO W/THROMBIN 8M KIT (HEMOSTASIS) IMPLANT
SUT BONE WAX W31G (SUTURE) ×3 IMPLANT
SUT MON AB 3-0 SH 27 (SUTURE) ×3
SUT MON AB 3-0 SH27 (SUTURE) ×1 IMPLANT
SUT SILK 2 0 (SUTURE)
SUT SILK 2-0 18XBRD TIE 12 (SUTURE) IMPLANT
SUT VIC AB 2-0 CT1 18 (SUTURE) ×3 IMPLANT
SYR BULB IRRIGATION 50ML (SYRINGE) ×3 IMPLANT
SYR CONTROL 10ML LL (SYRINGE) ×3 IMPLANT
TAPE CLOTH 4X10 WHT NS (GAUZE/BANDAGES/DRESSINGS) ×3 IMPLANT
TAPE UMBILICAL COTTON 1/8X30 (MISCELLANEOUS) ×3 IMPLANT
TOWEL GREEN STERILE (TOWEL DISPOSABLE) ×3 IMPLANT
TOWEL GREEN STERILE FF (TOWEL DISPOSABLE) ×3 IMPLANT
WATER STERILE IRR 1000ML POUR (IV SOLUTION) ×3 IMPLANT

## 2019-09-09 NOTE — Transfer of Care (Signed)
Immediate Anesthesia Transfer of Care Note  Patient: Manuel Johnson  Procedure(s) Performed: ANTERIOR CERVICAL DECOMPRESSION/DISCECTOMY FUSION C7-T1 (N/A Spine Cervical)  Patient Location: PACU  Anesthesia Type:General  Level of Consciousness: awake, alert  and oriented  Airway & Oxygen Therapy: Patient Spontanous Breathing and Patient connected to face mask oxygen  Post-op Assessment: Report given to RN and Post -op Vital signs reviewed and stable  Post vital signs: Reviewed and stable  Last Vitals:  Vitals Value Taken Time  BP 126/77 09/09/19 1229  Temp    Pulse 87 09/09/19 1231  Resp 17 09/09/19 1231  SpO2 100 % 09/09/19 1231  Vitals shown include unvalidated device data.  Last Pain:  Vitals:   09/09/19 0752  TempSrc:   PainSc: 6       Patients Stated Pain Goal: 3 (09/09/19 0752)  Complications: No apparent anesthesia complications

## 2019-09-09 NOTE — Anesthesia Procedure Notes (Signed)
Procedure Name: Intubation Performed by: Izola Price., CRNA Pre-anesthesia Checklist: Patient identified, Emergency Drugs available, Suction available and Patient being monitored Patient Re-evaluated:Patient Re-evaluated prior to induction Oxygen Delivery Method: Circle system utilized Preoxygenation: Pre-oxygenation with 100% oxygen Induction Type: IV induction Ventilation: Mask ventilation without difficulty and Oral airway inserted - appropriate to patient size Laryngoscope Size: Glidescope and 4 Grade View: Grade I Tube type: Oral Tube size: 7.5 mm Number of attempts: 1 Airway Equipment and Method: Stylet,  Oral airway and Video-laryngoscopy Placement Confirmation: ETT inserted through vocal cords under direct vision,  positive ETCO2 and breath sounds checked- equal and bilateral Secured at: 22 cm Tube secured with: Tape Dental Injury: Teeth and Oropharynx as per pre-operative assessment  Comments: Elective glide due to neck pain with extension

## 2019-09-09 NOTE — Anesthesia Preprocedure Evaluation (Signed)
Anesthesia Evaluation  Patient identified by MRN, date of birth, ID band Patient awake    Reviewed: Allergy & Precautions, NPO status , Patient's Chart, lab work & pertinent test results  Airway Mallampati: II  TM Distance: >3 FB Neck ROM: Full    Dental no notable dental hx.    Pulmonary neg pulmonary ROS, former smoker,    Pulmonary exam normal breath sounds clear to auscultation       Cardiovascular negative cardio ROS Normal cardiovascular exam Rhythm:Regular Rate:Normal     Neuro/Psych  Neuromuscular disease negative psych ROS   GI/Hepatic negative GI ROS, Neg liver ROS,   Endo/Other  negative endocrine ROS  Renal/GU negative Renal ROS  negative genitourinary   Musculoskeletal negative musculoskeletal ROS (+)   Abdominal   Peds negative pediatric ROS (+)  Hematology negative hematology ROS (+)   Anesthesia Other Findings   Reproductive/Obstetrics negative OB ROS                             Anesthesia Physical Anesthesia Plan  ASA: II  Anesthesia Plan: General   Post-op Pain Management:    Induction: Intravenous  PONV Risk Score and Plan: 2 and Ondansetron, Midazolam and Treatment may vary due to age or medical condition  Airway Management Planned: Oral ETT  Additional Equipment:   Intra-op Plan:   Post-operative Plan: Extubation in OR  Informed Consent: I have reviewed the patients History and Physical, chart, labs and discussed the procedure including the risks, benefits and alternatives for the proposed anesthesia with the patient or authorized representative who has indicated his/her understanding and acceptance.     Dental advisory given  Plan Discussed with: CRNA  Anesthesia Plan Comments:         Anesthesia Quick Evaluation

## 2019-09-09 NOTE — Op Note (Signed)
Operative report  Preoperative diagnosis: Left C7-T1 acute disc herniation with C8 radiculopathy  Postoperative diagnosis: Same  Operative procedure: Anterior cervical discectomy and fusion C7-T1  Complications: None  Implants: Titan 0 profile intervertebral cage size 8 lordotic medium.  Affixed with 2 locking screws 14 mm length by 3.8 diameter.  Allograft: vivogen  Indications: Manuel Johnson is a very active pleasant 49 year old gentleman who presents with acute onset of severe radicular left arm pain in the C8 dermatome.  He had both motor and sensory deficits and positive nerve root tension signs.  Imaging studies confirmed a large posterior lateral to the left disc herniation at C7-T1.  Attempts at conservative management failed to alleviate his symptoms and his quality of life rapidly deteriorated.  As a result we elected to move forward with surgery.  All appropriate risks benefits and alternatives were discussed with the patient and consent was obtained.  Operative report: Patient was brought the operating room placed upon the operating room table.  After successful induction of general anesthesia endotracheal ovation teds SCDs were applied the arms were tucked at the side and the anterior cervical spine was prepped and draped in a standard fashion.  Timeout was taken to confirm patient procedure and all other important data.  Using lateral fluoroscopy we identified the C7-T1 intervertebral space and marked out the incision site on the anterior aspect of the cervical spine.  This was then infiltrated with quarter percent Marcaine with epinephrine.  Transverse incision was made starting from the midline and proceeding to the left.  Sharply dissected down to the platysma and the platysma sharply incised to expose the underlying sternocleidomastoid.  Dissecting along the medial border of this into the deep cervical fascia identified the omohyoid.  This muscle belly was isolated and sacrificed to  allow for improved visualization of C7-T1  Once this was done I then bluntly dissected through the remainder of the deep cervical and prevertebral fascia until I could palpate the anterior spine.  Identified and protected the carotid sheath laterally with my finger and then swept the trachea and esophagus to the right.  Inhaled retractor was placed and I used Kitner dissectors to completely expose the C7-T1 level.  A needle was placed into the disc space and an x-ray was used to confirm that we are at the appropriate level.  Once this was confirmed I marked the disc space with the Bovie and continued my dissection.  Using bipolar cautery I mobilized the longus coli muscles from the inferior aspect of T1 to the superior aspect of C7.  Kaspar self-retaining retractors were placed underneath the longus coli muscle and the endotracheal cuff was deflated I expanded the retractor system and reinflated the endotracheal cuff.  I now had excellent visualization of the C7-T1 disc space.  Annulotomy was performed with a 15 blade scalpel and the bulk of the disc material was removed with pituitary rondure.  2 mm Kerrison rongeur was used to remove the overhanging osteophyte from the inferior aspect of the C7 vertebral body.  Distraction pins were placed into the bodies of C7 and T1 and I used a laminar spreader to expand the intervertebral space and maintain the distraction with the distraction pin set.  Using curettes I remove the bulk of the disc material and expose the bleeding subchondral bone.  As I was nearing the posterior annulus I then used my nerve hook to gently dissect into the left lateral aspect of the disc space.  I was able to mobilize and deliver  a 2 large fragments of disc material.  I then continue to use my 1 mm Kerrison rongeur to resect the posterior osteophyte from the vertebral bodies of C7 and T1 as well as undercut the uncovertebral joint on the left side.  This allowed me to use my nerve hook to  continue dissecting and mobilizing and removing 3 more additional fragments of disc material.  Once this was done I could clearly visualize the ventral surface of the thecal sac.  I had removed the posterior longitudinal ligament and confirmed that I had an adequate discectomy.  My nerve hook could freely pass underneath the vertebral bodies of C7 and T1 and underneath the uncovertebral joint.  Imaging studies confirmed that I had adequate decompression in the left side of the disc space.  Using my 1 mm Kerrison rongeur I carried my resection of the posterior annulus to the contralateral side and resected the overhanging osteophytes as well.  At this point I had excellent parallel endplate distractio as well as compression of the left C8 nerve root.  At this point I irrigated the wound copiously normal saline.  I then used my trial spacers and elected to use the size 8 medium spacer.  I rasped the endplates and ensured bleeding subchondral bone then obtained the implant and packed it with the allograft it was then malleted to the appropriate depth without issue.  I then secured it with 2 locking screws one going through the cage and into the C7 vertebral body the other going inferiorly through the cage into the T1 vertebral body.  Both screws had excellent purchase and secure.  The anterior locking plate was then applied and secured and torqued according manufacture standards.  At this point I used copious irrigation and confirmed hemostasis using proper electrocautery.  I then returned the trachea and esophagus to midline after removing all the retractors.  I then closed the platysma with interrupted 2-0 Vicryl suture and 3-0 Monocryl for the skin.  Steri-Strips and dry dressings and Aspen collar were applied and the patient was ultimately extubated transfer the PACU without incident.  The end of the case all needle and sponge counts were correct.  There were no adverse intraoperative events.

## 2019-09-09 NOTE — Brief Op Note (Signed)
09/09/2019  12:32 PM  PATIENT:  Manuel Johnson  49 y.o. male  PRE-OPERATIVE DIAGNOSIS:  Cervical radiculopathy and herniated disc  POST-OPERATIVE DIAGNOSIS:  Cervical radiculopathy and herniated disc  PROCEDURE:  Procedure(s) with comments: ANTERIOR CERVICAL DECOMPRESSION/DISCECTOMY FUSION C7-T1 (N/A) - 2.5 hrs  SURGEON:  Surgeon(s) and Role:    Venita Lick, MD - Primary  PHYSICIAN ASSISTANT:   ASSISTANTS: none   ANESTHESIA:   general  EBL: minimal  BLOOD ADMINISTERED:none  DRAINS: none   LOCAL MEDICATIONS USED:  MARCAINE     SPECIMEN:  No Specimen  DISPOSITION OF SPECIMEN:  N/A  COUNTS:  YES  TOURNIQUET:  * No tourniquets in log *  DICTATION: .Dragon Dictation  PLAN OF CARE: Admit for overnight observation  PATIENT DISPOSITION:  PACU - hemodynamically stable.

## 2019-09-09 NOTE — Discharge Instructions (Signed)

## 2019-09-09 NOTE — Anesthesia Postprocedure Evaluation (Signed)
Anesthesia Post Note  Patient: Aul Mangieri  Procedure(s) Performed: ANTERIOR CERVICAL DECOMPRESSION/DISCECTOMY FUSION C7-T1 (N/A Spine Cervical)     Patient location during evaluation: PACU Anesthesia Type: General Level of consciousness: awake and alert Pain management: pain level controlled Vital Signs Assessment: post-procedure vital signs reviewed and stable Respiratory status: spontaneous breathing, nonlabored ventilation, respiratory function stable and patient connected to nasal cannula oxygen Cardiovascular status: blood pressure returned to baseline and stable Postop Assessment: no apparent nausea or vomiting Anesthetic complications: no    Last Vitals:  Vitals:   09/09/19 1328 09/09/19 1329  BP: (!) 146/99 (!) 146/99  Pulse: 83 80  Resp:  18  Temp: 36.9 C 36.9 C  SpO2: 98% 99%                  Shelton Silvas

## 2019-09-09 NOTE — H&P (Signed)
HPI: Abbott is a very pleasant 49 year old male who was in his normal state of health until approximately 1 month ago he began having neck pain and severe radicular left arm pain, approximately 3 weeks ago he started noting weakness in his hand but has progressively gotten worse. He has been seeing a chiropractor and getting manipulative treatments. He is also taking narcotic medication including hydrocodone in addition to gabapentin and anti-inflammatory medications. He has had an MRI which revealed a large left-sided disc herniation at C7-T1 impinging upon the left C8 nerve.  I attempted conservative management including a selective nerve root block if failed to improve his faulty of life and as result he elected to move forward with surgery.  Patient denies fevers, chills, loss of consciousness.  He denies nausea, vomiting, or incontinence of bowel and bladder.  He is otherwise in good medical health.  Review of systems: As per HPI.  Allergies: prednisone - hives  Medications: cyclobenzaprine 10 mg tablet fluticasone propionate 50 mcg/actuation nasal spray,suspension Gabapentin 300 mg capsule HYDROcodone 5 mg-acetaminophen 325 mg tablet  metaxalone 800 mg tablet predniSONE 5 mg tablets in a dose pack traMADoL 50 mg tablet  Social History Tobacco Smoking Status: Never smoker Occupation: Physiological scientist Alcohol intake: Moderate  Surgical History Patient indicated no previous surgeries   Physical Exam  General: AAOX3, well developed and well nourished, NAD Ambulation: normal gait pattern, uses no assistive device. Inspection: No obvious deformity, scoleosis, kyphosis, loss of lordotic curve. Palpation: Non-tender over spinous processes and neck musculature.   AROM: - Normal cervical range of motion without any significant pain -Shoulder, elbow, and wrists AROM normal and pain free.   Dermatomes: Pain and dysesthesias in the left C8 dermatome pattern  Myotomes:  - shoulder  shrug: Left 5/5, Right 5/5 -Shoulder Abduction: Left 5/5, Right 5/5 - Elbow flexion: Left 5/5, Right 5/5 - Elbow extension Left 5/5, Right 5/5 - Wrist extension: Left 5/, right 5/5 - Finger abduction: Left 3/5, Right 5/5 - finger Adduction/squeeze: Left 5/5, Right 5/5  Reflexes:  - Biceps: Left2+, Right 2+ - Brachioradialius: Left2+, Right 2+ - Triceps: Left 2+, Right 2+ - Hoffman's: Negative  Special Tests: - UE Neural tension test: Left Positive, Right Positive PV: Extremities warm and well profused. 2+ radial pulses bilaterally  Lungs: Clear to auscultation bilaterally  Cardiac: Regular rate and rhythm no rubs gallops murmurs  Patient is alert and oriented 3. Cranial nerves II through XII intact.  Abdomen: Soft and nontender. No rebound tenderness, no incontinence of bowel or bladder.  Normal gait pattern. No significant shoulder, elbow, wrist pain with isolated joint range of motion.  MRI Impression: MRI of cervical spine dated August 27, 2019 to get an emergeortho. Both images as well as the report were personally reviewed by me and discussed with the patient. There are no cervical cord or intraspinal lesions. At C7-T1 there is a large left-sided disc herniation displacing the left C8 nerve root. At C5-6 and C6/7 there is degenerative disc disease without any significant stenosis or nerve root compression. There is straightening of the normal cervical lordosis.  C8 selective nerve root block was completed on 09/03/19: Patient reports no significant improvement. Assessment & Plan Diagnosis: Miki is a very pleasant 49 year old gentleman who returns to see me because of excruciating radicular left arm pain and axial neck pain. Despite the recent C8 selective nerve root block his overall quality-of-life his continue to deteriorate. Patient has focal motor, and sensory deficits in the C8 dermatome. Imaging  studies confirm a large posterior lateral to the left disc herniation at C7-T1  causing C8 nerve compression. His clinical exam is essentially unchanged from his initial office visit of 08/28/19.   At this point time we have had a long discussion concerning surgical intervention and he is eager to move forward with surgery. I have gone over the surgical procedure in great detail and all of his questions were encouraged and addressed. Risks and benefits of surgery were discussed with the patient. These include: Infection, bleeding, death, stroke, paralysis, ongoing or worse pain, need for additional surgery, nonunion, leak of spinal fluid, adjacent segment degeneration requiring additional fusion surgery. Pseudoarthrosis (nonunion)requiring supplemental posterior fixation. Throat pain, swallowing difficulties, hoarseness or change in voice.   Treatment plan: Given the severe pain and neurological deficits I think it is reasonable to move forward in an urgent fashion. We will attempt to move forward with surgery this week or at the latest next. Patient was provided an Biochemist, clinical which will who bring with him at the day of surgery.

## 2019-09-10 ENCOUNTER — Encounter: Payer: Self-pay | Admitting: *Deleted

## 2020-06-25 ENCOUNTER — Emergency Department (HOSPITAL_COMMUNITY)
Admission: EM | Admit: 2020-06-25 | Discharge: 2020-06-25 | Disposition: A | Discharge status: Home / Self Care | Payer: BC Managed Care – PPO | Attending: Emergency Medicine | Admitting: Emergency Medicine

## 2020-06-25 ENCOUNTER — Other Ambulatory Visit: Payer: Self-pay

## 2020-06-25 ENCOUNTER — Encounter (HOSPITAL_COMMUNITY): Payer: Self-pay

## 2020-06-25 ENCOUNTER — Emergency Department (HOSPITAL_COMMUNITY): Payer: BC Managed Care – PPO

## 2020-06-25 DIAGNOSIS — R42 Dizziness and giddiness: Secondary | ICD-10-CM | POA: Diagnosis not present

## 2020-06-25 DIAGNOSIS — Z79899 Other long term (current) drug therapy: Secondary | ICD-10-CM | POA: Diagnosis not present

## 2020-06-25 DIAGNOSIS — W010XXA Fall on same level from slipping, tripping and stumbling without subsequent striking against object, initial encounter: Secondary | ICD-10-CM | POA: Diagnosis not present

## 2020-06-25 DIAGNOSIS — Y92012 Bathroom of single-family (private) house as the place of occurrence of the external cause: Secondary | ICD-10-CM | POA: Insufficient documentation

## 2020-06-25 DIAGNOSIS — S0101XA Laceration without foreign body of scalp, initial encounter: Secondary | ICD-10-CM | POA: Diagnosis present

## 2020-06-25 DIAGNOSIS — Z7982 Long term (current) use of aspirin: Secondary | ICD-10-CM | POA: Insufficient documentation

## 2020-06-25 DIAGNOSIS — R55 Syncope and collapse: Secondary | ICD-10-CM | POA: Diagnosis not present

## 2020-06-25 DIAGNOSIS — Z87891 Personal history of nicotine dependence: Secondary | ICD-10-CM | POA: Insufficient documentation

## 2020-06-25 DIAGNOSIS — Z23 Encounter for immunization: Secondary | ICD-10-CM | POA: Insufficient documentation

## 2020-06-25 LAB — URINALYSIS, ROUTINE W REFLEX MICROSCOPIC
Bilirubin Urine: NEGATIVE
Glucose, UA: NEGATIVE mg/dL
Hgb urine dipstick: NEGATIVE
Ketones, ur: NEGATIVE mg/dL
Leukocytes,Ua: NEGATIVE
Nitrite: NEGATIVE
Protein, ur: NEGATIVE mg/dL
Specific Gravity, Urine: 1.005 (ref 1.005–1.030)
pH: 6 (ref 5.0–8.0)

## 2020-06-25 LAB — CBC
HCT: 45.9 % (ref 39.0–52.0)
Hemoglobin: 15.4 g/dL (ref 13.0–17.0)
MCH: 30.1 pg (ref 26.0–34.0)
MCHC: 33.6 g/dL (ref 30.0–36.0)
MCV: 89.8 fL (ref 80.0–100.0)
Platelets: 395 10*3/uL (ref 150–400)
RBC: 5.11 MIL/uL (ref 4.22–5.81)
RDW: 12.8 % (ref 11.5–15.5)
WBC: 10.4 10*3/uL (ref 4.0–10.5)
nRBC: 0 % (ref 0.0–0.2)

## 2020-06-25 LAB — BASIC METABOLIC PANEL
Anion gap: 9 (ref 5–15)
BUN: 15 mg/dL (ref 6–20)
CO2: 25 mmol/L (ref 22–32)
Calcium: 10 mg/dL (ref 8.9–10.3)
Chloride: 104 mmol/L (ref 98–111)
Creatinine, Ser: 0.91 mg/dL (ref 0.61–1.24)
GFR, Estimated: >60 mL/min (ref >60)
Glucose, Bld: 114 mg/dL — ABNORMAL HIGH (ref 70–99)
Potassium: 4.6 mmol/L (ref 3.5–5.1)
Sodium: 138 mmol/L (ref 135–145)

## 2020-06-25 LAB — CBG MONITORING, ED: Glucose-Capillary: 105 mg/dL — ABNORMAL HIGH (ref 70–99)

## 2020-06-25 MED ORDER — LIDOCAINE HCL (PF) 1 % IJ SOLN
15.0000 mL | Freq: Once | INTRAMUSCULAR | Status: DC
  Filled 2020-06-25: qty 30

## 2020-06-25 MED ORDER — TETANUS-DIPHTH-ACELL PERTUSSIS 5-2.5-18.5 LF-MCG/0.5 IM SUSY
0.5000 mL | PREFILLED_SYRINGE | Freq: Once | INTRAMUSCULAR | Status: AC
  Administered 2020-06-25: 0.5 mL via INTRAMUSCULAR
  Filled 2020-06-25: qty 0.5

## 2020-06-25 NOTE — ED Triage Notes (Signed)
Patient states he was up walking to the bathroom at 0200 today and had a syncopal episode. Patient has a laceration to the top of his head. Bleeding controlled. Patient states he hit the vanity in the bathroom.  Patient denies being on blood thinners, but states he takes Aspirin 81 mg daily.

## 2020-06-25 NOTE — Discharge Instructions (Addendum)
°  You are seen today for passing out, this is most likely vasovagal syncope but I want you to follow-up with your PCP about this, do not drive or climb ladders/operate heavy machinery until cleared by your PCP.  Please stay hydrated, if you feel like you are going to pass out again please come back to the emergency department today your work-up today was reassuring.  In regards to your laceration, please follow the attached instructions.  WOUND CARE Please return in 7 days to have your stitches/staples removed or sooner if you have concerns.  Keep area clean and dry for 24 hours. Do not remove bandage, if applied.  After 24 hours, remove bandage and wash wound gently with mild soap and warm water. Reapply a new bandage after cleaning wound, if directed.  Continue daily cleansing with soap and water until stitches/staples are removed.  Do not apply any ointments or creams to the wound while stitches/staples are in place, as this may cause delayed healing.  Notify the office if you experience any of the following signs of infection: Swelling, redness, pus drainage, streaking, fever >101.0 F  Notify the office if you experience excessive bleeding that does not stop after 15-20 minutes of constant, firm pressure.   Get help right away if: You have a very bad headache. You pass out once or more than once. You have pain in your chest, belly, or back. You have a very fast or uneven heartbeat (palpitations). It hurts to breathe. You are bleeding from your mouth or your bottom (rectum). You have black or tarry poop (stool). You have jerky movements that you cannot control (seizure). You are confused. You have trouble walking. You are very weak. You have vision problems.

## 2020-06-25 NOTE — ED Provider Notes (Signed)
[facility]-EMERGENCY DEPT Provider Note   CSN: [id number] Arrival date & time: 06/25/20  0944     History Chief Complaint  Patient presents with  . Head Laceration  . syncopal episode    Manuel Johnson is a 50 y.o. male past medical history of insomnia that presents to the emergency department today for syncopal episode.  Patient states that he was walking to the bathroom around 3 in the morning, had syncopal episode.  Patient states that he felt fine when he left his bed, when he got to the bathroom he started having cold sweats and started feeling lightheaded and dizzy, passed out.  Does not remember passing out, states that he thinks he was out for couple seconds.  States that he hit his head on the vanity on the way down.  States that he was able to get back up normally and came back to his baseline and then went back to bed.  States that he took a shower this morning, noticed that his head was hurting him and his scalp where he hit his head, his wife took a picture of the area and they came to the emergency department today to be evaluated for this.  Patient does have large head laceration.  Patient states that he did not have any vision changes, chest pain, palpitations,  HA,nausea or vomiting or aura before he passed out.  States that he was feeling fine before he went to bed.  States that he did have alcohol last night, had about 6 drinks before going to bed.  States this is pretty normal for him on the weekends.  Denies any symptoms currently besides scalp pain where he hit his head and has laceration.  Denies any fevers or chills.  Denies any recent long travel, history of blood clot, calf swelling or pain that is new, history of cancer, recent surgery.  States that he is generally healthy.  Denies any weakness, numbness, tingling, vision changes, neck pain, gait abnormality.  No other complaints.  Is not up-to-date on tetanus.  HPI     Past Medical History:   Diagnosis Date  . Bulging of cervical intervertebral disc    causing left arm pain and weakness per pt  . Insomnia 2015  . S/P LASIK surgery   . Seasonal allergies     Patient Active Problem List   Diagnosis Date Noted  . Upper back pain on left side 08/25/2019  . Left cervical radiculopathy 08/25/2019  . Hand weakness 08/25/2019  . Need for influenza vaccination 03/06/2019  . Chest discomfort 09/22/2016  . Palpitations 09/22/2016  . Encounter for health maintenance examination in adult 08/29/2016  . Elevated lipids 08/29/2016  . Family history of heart disease 08/29/2016  . Varicose veins of right lower extremity 08/29/2016  . Insomnia 12/13/2014  . Rhinitis, allergic 12/13/2014  . S/P LASIK surgery 12/13/2014  . Abnormal vision screen 12/13/2014    Past Surgical History:  Procedure Laterality Date  . ANTERIOR CERVICAL DECOMP/DISCECTOMY FUSION N/A 09/09/2019   Procedure: ANTERIOR CERVICAL DECOMPRESSION/DISCECTOMY FUSION C7-T1;  Surgeon: Venita Lick, MD;  Location: [facility] OR;  Service: Orthopedics;  Laterality: N/A;  2.5 hrs  . EYE SURGERY     lasik  . LASIK  2006       Family History  Problem Relation Age of Onset  . Alzheimer's disease Mother   . Heart disease Mother        angina  . Other Mother  died at 52yo from MVA  . Alcohol abuse Father   . Cancer Neg Hx   . Stroke Neg Hx   . Hypertension Neg Hx   . Diabetes Neg Hx     Social History   Tobacco Use  . Smoking status: Former Smoker    Packs/day: 1.00    Years: 5.00    Pack years: 5.00    Types: Cigarettes    Quit date: 10/10/2010    Years since quitting: 9.7  . Smokeless tobacco: Never Used  Vaping Use  . Vaping Use: Never used  Substance Use Topics  . Alcohol use: Yes    Alcohol/week: 6.0 standard drinks    Types: 6 Cans of beer per week    Comment: 6 drinks per week (weekends)  . Drug use: No    Home Medications Prior to Admission medications   Medication Sig Start Date End Date  Taking? Authorizing Provider  aspirin EC 81 MG tablet Take 81 mg by mouth daily. Swallow whole.   Yes [provider]  co-enzyme Q-10 30 MG capsule Take 30 mg by mouth daily.   Yes [provider]  fluticasone (FLONASE) 50 MCG/ACT nasal spray SPRAY 2 SPRAYS INTO EACH NOSTRIL EVERY DAY Patient taking differently: Place 1 spray into both nostrils daily as needed for allergies. 08/13/19  Yes Tysinger, Kermit Balo, PA-C  Multiple Vitamin (MULTIVITAMIN) capsule Take 1 capsule by mouth daily.   Yes [provider]  ondansetron (ZOFRAN) 4 MG tablet Take 1 tablet (4 mg total) by mouth every 8 (eight) hours as needed for nausea or vomiting. Patient not taking: Reported on 06/25/2020 09/09/19   Venita Lick, MD    Allergies    Prednisone  Review of Systems   Review of Systems  Constitutional: Negative for chills, diaphoresis, fatigue and fever.  HENT: Negative for congestion, sore throat and trouble swallowing.   Eyes: Negative for pain and visual disturbance.  Respiratory: Negative for cough, shortness of breath and wheezing.   Cardiovascular: Negative for chest pain, palpitations and leg swelling.  Gastrointestinal: Negative for abdominal distention, abdominal pain, diarrhea, nausea and vomiting.  Genitourinary: Negative for difficulty urinating.  Musculoskeletal: Negative for back pain, neck pain and neck stiffness.  Skin: Positive for wound. Negative for pallor.  Neurological: Positive for syncope. Negative for dizziness, speech difficulty, weakness and headaches.  Psychiatric/Behavioral: Negative for confusion.    Physical Exam Updated Vital Signs BP (!) 150/93   Pulse 70   Temp 97.7 F (36.5 C) (Oral)   Resp 18   Ht 6\' 4"  (1.93 m)   Wt 86.2 kg   SpO2 98%   BMI 23.13 kg/m   Physical Exam Constitutional:      General: He is not in acute distress.    Appearance: Normal appearance. He is not ill-appearing, toxic-appearing or diaphoretic.  HENT:     Head:  Normocephalic. Laceration present. No raccoon eyes, Battle's sign, abrasion, contusion or masses.     Jaw: There is normal jaw occlusion.      Comments: Patient with 4 inch laceration, superficial to scalp as depicted.  Bleeding has been controlled.  No lacerations noted elsewhere.    Mouth/Throat:     Mouth: Mucous membranes are moist.     Pharynx: Oropharynx is clear.  Eyes:     General: No scleral icterus.    Extraocular Movements: Extraocular movements intact.     Pupils: Pupils are equal, round, and reactive to light.  Cardiovascular:  Rate and Rhythm: Normal rate and regular rhythm.     Pulses: Normal pulses.     Heart sounds: Normal heart sounds.  Pulmonary:     Effort: Pulmonary effort is normal. No respiratory distress.     Breath sounds: Normal breath sounds. No stridor. No wheezing, rhonchi or rales.  Chest:     Chest wall: No tenderness.  Abdominal:     General: Abdomen is flat. There is no distension.     Palpations: Abdomen is soft.     Tenderness: There is no abdominal tenderness. There is no guarding or rebound.  Musculoskeletal:        General: No swelling or tenderness. Normal range of motion.     Cervical back: Normal range of motion and neck supple. No rigidity.     Right lower leg: No edema.     Left lower leg: No edema.  Skin:    General: Skin is warm and dry.     Capillary Refill: Capillary refill takes less than 2 seconds.     Coloration: Skin is not pale.  Neurological:     General: No focal deficit present.     Mental Status: He is alert and oriented to person, place, and time.     Comments: Alert. Clear speech. No facial droop. CNIII-XII grossly intact. Bilateral upper and lower extremities' sensation grossly intact. 5/5 symmetric strength with grip strength and with plantar and dorsi flexion bilaterally. Normal finger to nose bilaterally. Negative pronator drift. Negative Romberg sign. Gait is steady and intact    Psychiatric:        Mood and  Affect: Mood normal.        Behavior: Behavior normal.     ED Results / Procedures / Treatments   Labs (all labs ordered are listed, but only abnormal results are displayed) Labs Reviewed  BASIC METABOLIC PANEL - Abnormal; Notable for the following components:      Result Value   Glucose, Bld 114 (*)    All other components within normal limits  URINALYSIS, ROUTINE W REFLEX MICROSCOPIC - Abnormal; Notable for the following components:   Color, Urine STRAW (*)    All other components within normal limits  CBG MONITORING, ED - Abnormal; Notable for the following components:   Glucose-Capillary 105 (*)    All other components within normal limits  CBC    EKG EKG Interpretation  Date/Time:  Saturday June 25 2020 09:56:12 EST Ventricular Rate:  101 PR Interval:    QRS Duration: 86 QT Interval:  333 QTC Calculation: 432 R Axis:   74 Text Interpretation: Sinus tachycardia Right atrial enlargement Abnormal R-wave progression, early transition No old tracing to compare Confirmed by Margarita Grizzle [phone]) on 06/25/2020 11:44:50 AM   Radiology CT Head Wo Contrast  Result Date: 06/25/2020 CLINICAL DATA:  Syncope, head laceration EXAM: CT HEAD WITHOUT CONTRAST CT CERVICAL SPINE WITHOUT CONTRAST TECHNIQUE: Multidetector CT imaging of the head and cervical spine was performed following the standard protocol without intravenous contrast. Multiplanar CT image reconstructions of the cervical spine were also generated. COMPARISON:  None. FINDINGS: CT HEAD FINDINGS Brain: No evidence of acute infarction, hemorrhage, hydrocephalus, extra-axial collection or mass lesion/mass effect. Vascular: No hyperdense vessel or unexpected calcification. Skull: Normal. Negative for fracture or focal lesion. Sinuses/Orbits: The visualized paranasal sinuses are essentially clear. The mastoid air cells are unopacified. Other: Soft tissue laceration along the right vertex (series 4/image 80). CT CERVICAL SPINE  FINDINGS Alignment: Normal cervical lordosis. Skull base and  vertebrae: No acute fracture. No primary bone lesion or focal pathologic process. Soft tissues and spinal canal: No prevertebral fluid or swelling. No visible canal hematoma. Disc levels: Postsurgical changes at C7-T1. Very mild degenerative changes of the lower cervical spine. Spinal canal is patent. Upper chest: Very mild biapical pleural-parenchymal scarring. Lungs are otherwise clear. Other: Visualized thyroid is unremarkable. IMPRESSION: Soft tissue laceration along the right vertex. No evidence of calvarial fracture. No evidence of acute intracranial abnormality. No evidence of traumatic injury to the cervical spine. Postsurgical changes at C7-T1. Very mild degenerative changes of the lower cervical spine. Electronically Signed   By: Charline Bills M.D.   On: 06/25/2020 14:08   CT Cervical Spine Wo Contrast  Result Date: 06/25/2020 CLINICAL DATA:  Syncope, head laceration EXAM: CT HEAD WITHOUT CONTRAST CT CERVICAL SPINE WITHOUT CONTRAST TECHNIQUE: Multidetector CT imaging of the head and cervical spine was performed following the standard protocol without intravenous contrast. Multiplanar CT image reconstructions of the cervical spine were also generated. COMPARISON:  None. FINDINGS: CT HEAD FINDINGS Brain: No evidence of acute infarction, hemorrhage, hydrocephalus, extra-axial collection or mass lesion/mass effect. Vascular: No hyperdense vessel or unexpected calcification. Skull: Normal. Negative for fracture or focal lesion. Sinuses/Orbits: The visualized paranasal sinuses are essentially clear. The mastoid air cells are unopacified. Other: Soft tissue laceration along the right vertex (series 4/image 80). CT CERVICAL SPINE FINDINGS Alignment: Normal cervical lordosis. Skull base and vertebrae: No acute fracture. No primary bone lesion or focal pathologic process. Soft tissues and spinal canal: No prevertebral fluid or swelling. No visible  canal hematoma. Disc levels: Postsurgical changes at C7-T1. Very mild degenerative changes of the lower cervical spine. Spinal canal is patent. Upper chest: Very mild biapical pleural-parenchymal scarring. Lungs are otherwise clear. Other: Visualized thyroid is unremarkable. IMPRESSION: Soft tissue laceration along the right vertex. No evidence of calvarial fracture. No evidence of acute intracranial abnormality. No evidence of traumatic injury to the cervical spine. Postsurgical changes at C7-T1. Very mild degenerative changes of the lower cervical spine. Electronically Signed   By: Charline Bills M.D.   On: 06/25/2020 14:08    Procedures .Marland KitchenLaceration Repair  Date/Time: 06/25/2020 12:43 PM Performed by: Farrel Gordon, PA-C Authorized by: Farrel Gordon, PA-C   Consent:    Consent obtained:  Verbal   Consent given by:  Patient   Risks discussed:  Infection, need for additional repair, pain, poor cosmetic result and poor wound healing   Alternatives discussed:  No treatment and delayed treatment Universal protocol:    Procedure explained and questions answered to patient or proxy's satisfaction: yes     Relevant documents present and verified: yes     Test results available: yes     Imaging studies available: yes     Required blood products, implants, devices, and special equipment available: yes     Site/side marked: yes     Immediately prior to procedure, a time out was called: yes     Patient identity confirmed:  Verbally with patient Anesthesia:    Anesthesia method:  None (Pt didnt want anesthesia) Laceration details:    Location:  Scalp   Scalp location:  Mid-scalp   Length (cm):  13   Depth (mm):  2 Pre-procedure details:    Preparation:  Patient was prepped and draped in usual sterile fashion and imaging obtained to evaluate for foreign bodies Exploration:    Hemostasis achieved with:  Direct pressure   Imaging outcome: foreign body not noted  Wound exploration: wound  explored through full range of motion and entire depth of wound visualized     Contaminated: no   Treatment:    Area cleansed with:  Chlorhexidine and saline   Amount of cleaning:  Extensive   Irrigation solution:  Sterile saline   Irrigation volume:  700 cc   Irrigation method:  Syringe   Visualized foreign bodies/material removed: no     Debridement:  None   Undermining:  None Skin repair:    Repair method:  Staples   Number of staples:  15 Approximation:    Approximation:  Close Repair type:    Repair type:  Simple Post-procedure details:    Dressing:  Open (no dressing)   (including critical care time)  Medications Ordered in ED Medications  lidocaine (PF) (XYLOCAINE) 1 % injection 15 mL (15 mLs Infiltration Given 06/25/20 1233)  Tdap (BOOSTRIX) injection 0.5 mL (0.5 mLs Intramuscular Given 06/25/20 1239)    ED Course  I have reviewed the triage vital signs and the nursing notes.  Pertinent labs & imaging results that were available during my care of the patient were reviewed by me and considered in my medical decision making (see chart for details).    MDM Rules/Calculators/A&P                         Abigail MiyamotoGerald Crichlow is a 50 y.o. male past medical history of insomnia that presents to the emergency department today for syncopal episode.  Patient does have a 4 inch laceration to head, bleeding has been controlled.  Normal neuro exam.  In regards to syncopal episode, patient did have lightheadedness and cold sweats before this, most likely vasovagal from standing up too fast in the middle the night, could also have aspect of dehydration from drinking alcohol last night.  Will obtain some basic labs, EKG and CT head and neck at this time.  Reassuring work-up.  CT head and neck without any acute abnormality except for superficial laceration.  Head laceration stapled without any complications. Is not contaminated. Was thoroughly irrigated/ Patient does not want any pain  medications.  Tetanus updated.  Patient without arrhythmia or tachycardia while here in the department.  Patient without history of congestive heart failure, normal hematocrit, normal ECG, no shortness of breath and systolic blood pressure greater than 90; patient is low risk. Will plan for discharge home with close cPCP follow-up.  Possibility of recurrent syncope has been discussed. I discussed reasons to avoid driving until cardiology/PCP followup and other safety prevention including use of ladders and working at heights.    BP (!) 150/93   Pulse 70   Temp 97.7 F (36.5 C) (Oral)   Resp 18   Ht 6\' 4"  (1.93 m)   Wt 86.2 kg   SpO2 98%   BMI 23.13 kg/m    Strict return precautions given in regards to laceration and syncopal episode.  Patient expressed understanding.  Repeat normal neuro exam.  Patient be discharged at this time.  Doubt need for further emergent work up at this time. I explained the diagnosis and have given explicit precautions to return to the ER including for any other new or worsening symptoms. The patient understands and accepts the medical plan as it's been dictated and I have answered their questions. Discharge instructions concerning home care and prescriptions have been given. The patient is STABLE and is discharged to home in good condition.  I discussed this case with  my attending physician who cosigned this note including patient's presenting symptoms, physical exam, and planned diagnostics and interventions. Attending physician stated agreement with plan or made changes to plan which were implemented.    Final Clinical Impression(s) / ED Diagnoses Final diagnoses:  Laceration of scalp, initial encounter  Vasovagal syncope    Rx / DC Orders ED Discharge Orders    None       Farrel Gordonatel, Soleil Mas, PA-C 06/25/20 1502    Margarita Grizzleay, Danielle, MD 06/26/20 [phone]

## 2020-12-21 ENCOUNTER — Other Ambulatory Visit: Payer: Self-pay

## 2020-12-21 ENCOUNTER — Encounter: Payer: Self-pay | Admitting: Family Medicine

## 2020-12-21 ENCOUNTER — Telehealth: Payer: BC Managed Care – PPO | Admitting: Family Medicine

## 2020-12-21 VITALS — Temp 97.5°F | Ht 72.0 in | Wt 196.0 lb

## 2020-12-21 DIAGNOSIS — R058 Other specified cough: Secondary | ICD-10-CM

## 2020-12-21 DIAGNOSIS — U071 COVID-19: Secondary | ICD-10-CM

## 2020-12-21 MED ORDER — AZITHROMYCIN 250 MG PO TABS: ORAL_TABLET | ORAL | 0 refills | Status: DC

## 2020-12-21 MED ORDER — BENZONATATE 200 MG PO CAPS: 200.0000 mg | ORAL_CAPSULE | Freq: Three times a day (TID) | ORAL | 0 refills | Status: DC | PRN

## 2020-12-21 NOTE — Patient Instructions (Signed)
Drink plenty of water. Take the z-pak as directed (and remember that it stays working for a full 10 days). Use Mucinex 12 hour table twice daily (you can either use the plain version, and use Delsym syrup if needed for cough, OR get the Mucinex DM which contains the same cough suppressant).  We sent in Tessalon perles for you to use if needed for cough.  Contact us in 5-7 days if no improvement or getting worse, to have antibiotic changed . If you see any improvement, then hold off for 10 days. If you aren't 100% better in 10 days, let us know (with details about what symptoms still remain).  I'm presuming that you have/had COVID--you did the rapid test too soon, and didn't repeat it afterwards. Recommendations for isolation are for 10 days (7/12 counts as your first day, the first full day of symptoms, so 10 days would be until 7/22). Since you are coughing so much, I'd probably stay away from others and mask up until you're feeling better.

## 2020-12-21 NOTE — Progress Notes (Signed)
Start time: 12:15 End time: 12:37  Virtual Visit via Video Note  I connected with Manuel Johnson on 12/21/20 by a video enabled telemedicine application and verified that I am speaking with the correct person using two identifiers.  Location: Patient: home Provider: office   I discussed the limitations of evaluation and management by telemedicine and the availability of in person appointments. The patient expressed understanding and agreed to proceed.  History of Present Illness:  Chief Complaint  Patient presents with   Cough    VIRTUAL cough and coughing up a lot of phlegm-mucus is dark in color with traces of blood. When he blows his nose as well. Chest heaviness when he is laying down. Very fatigued, some chills. Symptoms started last Monday.    Son and wife tested positive for COVID last week (7/10, 7/11). He started with chills, fatigue, headache on the night of 7/11. Tested negative 7/12 with a rapid test.  Didn't repeat the test.  After a couple of days of fatigue and chills, felt better, was back to normal by Friday. Now he is coughing up more phlegm.  It started off light, and is now brownish-yellow.  He does notice some dried blood in his nose, and nasal drainage is yellow-green. Denies any sinus pain.  His back feels cold now, no chills (had last week).  Using Vick's nasal spray, sudafed, and drinking lots of water.  He coughs more if he doesn't drink enough water. Cough is worse at night.  H/o bronchitis or walking pneumonia, feels similar. He has heaviness in the chest when he lays down. He denies any chest pain, shortness of breath/DOE.  Had COVID vaccines x 3  PMH, PSH, SH reviewed  Outpatient Encounter Medications as of 12/21/2020  Medication Sig   aspirin EC 81 MG tablet Take 81 mg by mouth daily. Swallow whole.   Calcium-Magnesium 200-50 MG TABS Take 1 tablet by mouth daily.   co-enzyme Q-10 30 MG capsule Take 30 mg by mouth daily.   fluticasone  (FLONASE) 50 MCG/ACT nasal spray SPRAY 2 SPRAYS INTO EACH NOSTRIL EVERY DAY (Patient taking differently: Place 1 spray into both nostrils daily as needed for allergies.)   Multiple Vitamin (MULTIVITAMIN) capsule Take 1 capsule by mouth daily.   ondansetron (ZOFRAN) 4 MG tablet Take 1 tablet (4 mg total) by mouth every 8 (eight) hours as needed for nausea or vomiting. (Patient not taking: No sig reported)   No facility-administered encounter medications on file as of 12/21/2020.    Allergies  Allergen Reactions   Prednisone Hives   ROS:  fever, chills, headache, fatigue, URI symptoms per HPI. No n/v/d No bleeding, bruising rashes No loss of taste/smell. No chest pain, shortness of breath or other complaints. See HPI   Observations/Objective:  Temp (!) 97.5 F (36.4 C) (Temporal)   Ht 6' (1.829 m)   Wt 196 lb (88.9 kg)   BMI 26.58 kg/m   Well-appearing, pleasant male. He is alert, oriented, speaking easily, in no distress. No coughing noted during visit. Exam is limited due to virtual nature of the visit.   Assessment and Plan:  COVID-19 virus infection - presumptive--+household contacts, symptoms, and tested too early without repeating. Discussed isolation recommendations, d1 7/12  Cough productive of purulent sputum - suspect secondary sinusitis, with drainage into chest.  Mucinex, ABX. f/u if not better or if worse - Plan: azithromycin (ZITHROMAX) 250 MG tablet, benzonatate (TESSALON) 200 MG capsule   Z-pak, mucinex, DM, tessalon   Follow Up  Instructions:    I discussed the assessment and treatment plan with the patient. The patient was provided an opportunity to ask questions and all were answered. The patient agreed with the plan and demonstrated an understanding of the instructions.   The patient was advised to call back or seek an in-person evaluation if the symptoms worsen or if the condition fails to improve as anticipated.  I spent 25 minutes dedicated to the  care of this patient, including pre-visit review of records, face to face time, post-visit ordering of testing and documentation.    Lavonda Jumbo, MD

## 2021-05-16 ENCOUNTER — Encounter: Payer: Self-pay | Admitting: Medical

## 2021-05-16 ENCOUNTER — Ambulatory Visit: Payer: BC Managed Care – PPO | Admitting: Medical

## 2021-05-16 ENCOUNTER — Other Ambulatory Visit: Payer: Self-pay

## 2021-05-16 VITALS — BP 132/84 | HR 89 | Ht 72.0 in | Wt 194.2 lb

## 2021-05-16 DIAGNOSIS — R300 Dysuria: Secondary | ICD-10-CM | POA: Diagnosis not present

## 2021-05-16 DIAGNOSIS — R350 Frequency of micturition: Secondary | ICD-10-CM

## 2021-05-16 LAB — POCT URINALYSIS DIP (PROADVANTAGE DEVICE)
Bilirubin, UA: NEGATIVE
Blood, UA: NEGATIVE
Glucose, UA: NEGATIVE mg/dL
Ketones, POC UA: NEGATIVE mg/dL
Leukocytes, UA: NEGATIVE
Nitrite, UA: NEGATIVE
Protein Ur, POC: NEGATIVE mg/dL
Specific Gravity, Urine: 1.03
Urobilinogen, Ur: 0.2
pH, UA: 6 (ref 5.0–8.0)

## 2021-05-16 MED ORDER — SULFAMETHOXAZOLE-TRIMETHOPRIM 800-160 MG PO TABS: 1.0000 | ORAL_TABLET | Freq: Two times a day (BID) | ORAL | 0 refills | Status: DC

## 2021-05-16 NOTE — Progress Notes (Signed)
Subjective:  Manuel Johnson is a 50 y.o. male who presents for Chief Complaint  Patient presents with   Urinary Frequency    Increased urinary frequency since last Wednesday. Has a stinging burn after urinating     He notes almost a week history of urinary urgency.  Having to run to bathroom a lot. At times only small amounts come out.  Has stinging at end of urine stream.   No abdominal or back pain.  No fever.  No nausea, no vomiting.  No blood in urine.  the more he drinks the more he has to urinate.    Leading up to this week was not having urine dribbling or incontinence or weak stream.  Not particularly thirsty.  No blurred vision.  Wife without recent infection.  No other aggravating or relieving factors.    No other c/o.  Past Medical History:  Diagnosis Date   Bulging of cervical intervertebral disc    causing left arm pain and weakness per pt   Insomnia 2015   S/P LASIK surgery    Seasonal allergies    Family History  Problem Relation Age of Onset   Alzheimer's disease Mother    Heart disease Mother        angina   Other Mother        died at 10yo from MVA   Alcohol abuse Father    Cancer Neg Hx    Stroke Neg Hx    Hypertension Neg Hx    Diabetes Neg Hx    Past Surgical History:  Procedure Laterality Date   ANTERIOR CERVICAL DECOMP/DISCECTOMY FUSION N/A 09/09/2019   Procedure: ANTERIOR CERVICAL DECOMPRESSION/DISCECTOMY FUSION C7-T1;  Surgeon: Venita Lick, MD;  Location: [facility];  Service: Orthopedics;  Laterality: N/A;  2.5 hrs   EYE SURGERY     lasik   LASIK  2006   Current Outpatient Medications on File Prior to Visit  Medication Sig Dispense Refill   aspirin EC 81 MG tablet Take 81 mg by mouth daily. Swallow whole.     Calcium-Magnesium 200-50 MG TABS Take 1 tablet by mouth daily.     co-enzyme Q-10 30 MG capsule Take 30 mg by mouth daily.     fluticasone (FLONASE) 50 MCG/ACT nasal spray SPRAY 2 SPRAYS INTO EACH NOSTRIL EVERY DAY (Patient not taking: Reported  on 05/16/2021) 48 mL 0   Multiple Vitamin (MULTIVITAMIN) capsule Take 1 capsule by mouth daily. (Patient not taking: Reported on 05/16/2021)     No current facility-administered medications on file prior to visit.     The following portions of the patient's history were reviewed and updated as appropriate: allergies, current medications, past family history, past medical history, past social history, past surgical history and problem list.  ROS Otherwise as in subjective above    Objective: BP 132/84 (BP Location: Right Arm, Patient Position: Sitting)    Pulse 89    Ht 6' (1.829 m)    Wt 194 lb 3.2 oz (88.1 kg)    SpO2 99%    BMI 26.34 kg/m   General appearance: alert, no distress, well developed, well nourished Abdomen and back exam noncontributory    Assessment: Encounter Diagnoses  Name Primary?   Urinary frequency Yes   Dysuria      Plan: We discussed possible causes and differential.  We will treat empirically for urinary tract infection.  Urine culture sent.  Urinalysis today normal.  Hydrate well with water.  If worse in the next few  days then call or recheck   Manuel Johnson was seen today for urinary frequency.  Diagnoses and all orders for this visit:  Urinary frequency -     POCT Urinalysis DIP (Proadvantage Device) -     Urine Culture  Dysuria -     Urine Culture  Other orders -     sulfamethoxazole-trimethoprim (BACTRIM DS) 800-160 MG tablet; Take 1 tablet by mouth 2 (two) times daily.    Follow up: pending culture

## 2021-05-20 LAB — URINE CULTURE: Organism ID, Bacteria: NO GROWTH

## 2021-05-22 ENCOUNTER — Other Ambulatory Visit: Payer: Self-pay | Admitting: Medical

## 2021-05-22 MED ORDER — DOXYCYCLINE MONOHYDRATE 100 MG PO TABS: 100.0000 mg | ORAL_TABLET | Freq: Two times a day (BID) | ORAL | 0 refills | Status: DC

## 2021-07-05 ENCOUNTER — Encounter: Payer: Self-pay | Admitting: Medical

## 2021-07-05 ENCOUNTER — Ambulatory Visit (INDEPENDENT_AMBULATORY_CARE_PROVIDER_SITE_OTHER): Payer: BC Managed Care – PPO | Admitting: Medical

## 2021-07-05 ENCOUNTER — Other Ambulatory Visit: Payer: Self-pay

## 2021-07-05 VITALS — BP 120/80 | HR 57 | Ht 72.0 in | Wt 194.6 lb

## 2021-07-05 DIAGNOSIS — G4700 Insomnia, unspecified: Secondary | ICD-10-CM

## 2021-07-05 DIAGNOSIS — Z8249 Family history of ischemic heart disease and other diseases of the circulatory system: Secondary | ICD-10-CM | POA: Diagnosis not present

## 2021-07-05 DIAGNOSIS — Z136 Encounter for screening for cardiovascular disorders: Secondary | ICD-10-CM

## 2021-07-05 DIAGNOSIS — M62838 Other muscle spasm: Secondary | ICD-10-CM

## 2021-07-05 DIAGNOSIS — I8391 Asymptomatic varicose veins of right lower extremity: Secondary | ICD-10-CM | POA: Diagnosis not present

## 2021-07-05 DIAGNOSIS — Z0000 Encounter for general adult medical examination without abnormal findings: Secondary | ICD-10-CM | POA: Diagnosis not present

## 2021-07-05 DIAGNOSIS — Z9889 Other specified postprocedural states: Secondary | ICD-10-CM | POA: Diagnosis not present

## 2021-07-05 DIAGNOSIS — Z125 Encounter for screening for malignant neoplasm of prostate: Secondary | ICD-10-CM

## 2021-07-05 DIAGNOSIS — L608 Other nail disorders: Secondary | ICD-10-CM

## 2021-07-05 DIAGNOSIS — Z131 Encounter for screening for diabetes mellitus: Secondary | ICD-10-CM

## 2021-07-05 DIAGNOSIS — J301 Allergic rhinitis due to pollen: Secondary | ICD-10-CM

## 2021-07-05 DIAGNOSIS — Z1211 Encounter for screening for malignant neoplasm of colon: Secondary | ICD-10-CM

## 2021-07-05 LAB — LIPID PANEL

## 2021-07-05 NOTE — Progress Notes (Signed)
Subjective:   HPI  Manuel Johnson is a 51 y.o. male who presents for Chief Complaint  Patient presents with   fasting cpe    Fasting cpe, sleeping issues.     Patient Care Team: Adiva Boettner, Leward Quan as PCP - General (Family Medicine) Sees dentist Sees eye doctor  Concerns: He has a history of insomnia.  He would like to have some Ambien to use as needed.  He has tried other medicines in the past.  He did not tolerate Xanax or melatonin.  Melatonin makes him feel unusual and Xanax made him feel down in his mid  He had a recent cold and has a lot of coughing to the point he thinks he pulled a muscle in his upper back.  Been achy in the back for the last week.  No other injury.  Otherwise been in usual state of health  Reviewed their medical, surgical, family, social, medication, and allergy history and updated chart as appropriate.  Past Medical History:  Diagnosis Date   Bulging of cervical intervertebral disc    causing left arm pain and weakness per pt   Insomnia 2015   Insomnia    S/P LASIK surgery    Seasonal allergies     Past Surgical History:  Procedure Laterality Date   ANTERIOR CERVICAL DECOMP/DISCECTOMY FUSION N/A 09/09/2019   Procedure: ANTERIOR CERVICAL DECOMPRESSION/DISCECTOMY FUSION C7-T1;  Surgeon: Melina Schools, MD;  Location: [place];  Service: Orthopedics;  Laterality: N/A;  2.5 hrs   EYE SURGERY     lasik   LASIK  2006    Family History  Problem Relation Age of Onset   Alzheimer's disease Mother    Heart disease Mother        angina   Other Mother        died at 11yo from MVA   Alcohol abuse Father    Cancer Neg Hx    Stroke Neg Hx    Hypertension Neg Hx    Diabetes Neg Hx      Current Outpatient Medications:    aspirin EC 81 MG tablet, Take 81 mg by mouth daily. Swallow whole., Disp: , Rfl:    Calcium-Magnesium 200-50 MG TABS, Take 1 tablet by mouth daily., Disp: , Rfl:    co-enzyme Q-10 30 MG capsule, Take 30 mg by mouth daily., Disp:  , Rfl:    Multiple Vitamin (MULTIVITAMIN) capsule, Take 1 capsule by mouth daily., Disp: , Rfl:   Allergies  Allergen Reactions   Xanax [Alprazolam]     Made him feel sad, withdrawal type reaction   Prednisone Hives     Review of Systems Constitutional: -fever, -chills, -sweats, -unexpected weight change, -decreased appetite, -fatigue Allergy: -sneezing, -itching, -congestion Dermatology: -changing moles, --rash, -lumps ENT: -runny nose, -ear pain, -sore throat, -hoarseness, -sinus pain, -teeth pain, - ringing in ears, -hearing loss, -nosebleeds Cardiology: -chest pain, -palpitations, -swelling, -difficulty breathing when lying flat, -waking up short of breath Respiratory: -cough, -shortness of breath, -difficulty breathing with exercise or exertion, -wheezing, -coughing up blood Gastroenterology: -abdominal pain, -nausea, -vomiting, -diarrhea, -constipation, -blood in stool, -changes in bowel movement, -difficulty swallowing or eating Hematology: -bleeding, -bruising  Musculoskeletal: -joint aches, +muscle aches, -joint swelling, -back pain, -neck pain, -cramping, -changes in gait Ophthalmology: denies vision changes, eye redness, itching, discharge Urology: -burning with urination, -difficulty urinating, -blood in urine, -urinary frequency, -urgency, -incontinence Neurology: -headache, -weakness, -tingling, -numbness, -memory loss, -falls, -dizziness Psychology: -depressed mood, -agitation, +sleep problems Male GU: no testicular  mass, pain, no lymph nodes swollen, no swelling, no rash.  Depression screen [facility] 2/9 07/05/2021 05/16/2021 03/06/2019 07/14/2018 03/21/2018  Decreased Interest 0 0 0 0 0  Down, Depressed, Hopeless 0 0 0 0 0  PHQ - 2 Score 0 0 0 0 0        Objective:  BP 120/80    Pulse (!) 57    Ht 6' (1.829 m)    Wt 194 lb 9.6 oz (88.3 kg)    SpO2 98%    BMI 26.39 kg/m   General appearance: alert, no distress, WD/WN, Caucasian male Skin: bilat thumb nails and few other  nails with thickened receded nails.   Otherwise no worrisome nail or skin lesions HEENT: normocephalic, conjunctiva/corneas normal, sclerae anicteric, PERRLA, EOMi Neck: supple, no lymphadenopathy, no thyromegaly, no masses, normal ROM, no bruits Chest: non tender, normal shape and expansion Heart: RRR, normal S1, S2, no murmurs Lungs: CTA bilaterally, no wheezes, rhonchi, or rales Abdomen: +bs, soft, non tender, non distended, no masses, no hepatomegaly, no splenomegaly, no bruits Back: Tender in the upper back paraspinal otherwise non tender, normal ROM, no scoliosis Musculoskeletal: upper extremities non tender, no obvious deformity, normal ROM throughout, lower extremities non tender, no obvious deformity, normal ROM throughout Extremities: no edema, no cyanosis, no clubbing Pulses: 2+ symmetric, upper and lower extremities, normal cap refill Neurological: alert, oriented x 3, CN2-12 intact, strength normal upper extremities and lower extremities, sensation normal throughout, DTRs 2+ throughout, no cerebellar signs, gait normal Psychiatric: normal affect, behavior normal, pleasant  GU: normal male external genitalia,uncircumcised, nontender, no masses, no hernia, no lymphadenopathy Rectal: anus normal tone, prostate WNL   Assessment and Plan :   Encounter Diagnoses  Name Primary?   Encounter for health maintenance examination in adult Yes   Family history of heart disease    Varicose veins of right lower extremity, unspecified whether complicated    S/P LASIK surgery    Seasonal allergic rhinitis due to pollen    Insomnia, unspecified type    Screening for prostate cancer    Screen for colon cancer    Screening for diabetes mellitus    Nail deformity    Muscle spasm    Screening for heart disease     This visit was a preventative care visit, also known as wellness visit or routine physical.   Topics typically include healthy lifestyle, diet, exercise, preventative care,  vaccinations, sick and well care, proper use of emergency dept and after hours care, as well as other concerns.     Recommendations: Continue to return yearly for your annual wellness and preventative care visits.  This gives [place] a chance to discuss healthy lifestyle, exercise, vaccinations, review your chart record, and perform screenings where appropriate.  I recommend you see your eye doctor yearly for routine vision care.  I recommend you see your dentist yearly for routine dental care including hygiene visits twice yearly.   Vaccination recommendations were reviewed Immunization History  Administered Date(s) Administered   Influenza,inj,Quad PF,6+ Mos 03/10/2014, 03/06/2019   Tdap 12/13/2014, 06/25/2020    Shingles vaccine:  I recommend you have a shingles vaccine to help prevent shingles or herpes zoster outbreak.   Please call your insurer to inquire about coverage for the Shingrix vaccine given in 2 doses.   Some insurers cover this vaccine after age 58, some cover this after age 43.  If your insurer covers this, then call to schedule appointment to have this vaccine here.   Screening  for cancer: Colon cancer screening: We will refer you for screening colonoscopy  We discussed PSA, prostate exam, and prostate cancer screening risks/benefits.     Skin cancer screening: Check your skin regularly for new changes, growing lesions, or other lesions of concern Come in for evaluation if you have skin lesions of concern.  Lung cancer screening: If you have a greater than 20 pack year history of tobacco use, then you may qualify for lung cancer screening with a chest CT scan.   Please call your insurance company to inquire about coverage for this test.  We currently don't have screenings for other cancers besides breast, cervical, colon, and lung cancers.  If you have a strong family history of cancer or have other cancer screening concerns, please let me know.    Bone health: Get  at least 150 minutes of aerobic exercise weekly Get weight bearing exercise at least once weekly Bone density test:  A bone density test is an imaging test that uses a type of X-ray to measure the amount of calcium and other minerals in your bones. The test may be used to diagnose or screen you for a condition that causes weak or thin bones (osteoporosis), predict your risk for a broken bone (fracture), or determine how well your osteoporosis treatment is working. The bone density test is recommended for females 67 and older, or females or males [id number] if certain risk factors such as thyroid disease, long term use of steroids such as for asthma or rheumatological issues, vitamin D deficiency, estrogen deficiency, family history of osteoporosis, self or family history of fragility fracture in first degree relative.    Heart health: Get at least 150 minutes of aerobic exercise weekly Limit alcohol It is important to maintain a healthy blood pressure and healthy cholesterol numbers  Heart disease screening: Screening for heart disease includes screening for blood pressure, fasting lipids, glucose/diabetes screening, BMI height to weight ratio, reviewed of smoking status, physical activity, and diet.    Goals include blood pressure 120/80 or less, maintaining a healthy lipid/cholesterol profile, preventing diabetes or keeping diabetes numbers under good control, not smoking or using tobacco products, exercising most days per week or at least 150 minutes per week of exercise, and eating healthy variety of fruits and vegetables, healthy oils, and avoiding unhealthy food choices like fried food, fast food, high sugar and high cholesterol foods.    Other tests may possibly include EKG test, CT coronary calcium score, echocardiogram, exercise treadmill stress test.   Referral today for CT coronary calcium score    Medical care options: I recommend you continue to seek care here first for routine care.   We try really hard to have available appointments Monday through Friday daytime hours for sick visits, acute visits, and physicals.  Urgent care should be used for after hours and weekends for significant issues that cannot wait till the next day.  The emergency department should be used for significant potentially life-threatening emergencies.  The emergency department is expensive, can often have long wait times for less significant concerns, so try to utilize primary care, urgent care, or telemedicine when possible to avoid unnecessary trips to the emergency department.  Virtual visits and telemedicine have been introduced since the pandemic started in 2020, and can be convenient ways to receive medical care.  We offer virtual appointments as well to assist you in a variety of options to seek medical care.   Advanced Directives: I recommend you consider completing a Health  Care Power of Attorney and Living Will.   These documents respect your wishes and help alleviate burdens on your loved ones if you were to become terminally ill or be in a position to need those documents enforced.    You can complete Advanced Directives yourself, have them notarized, then have copies made for our office, for you and for anybody you feel should have them in safe keeping.  Or, you can have an attorney prepare these documents.   If you haven't updated your Last Will and Testament in a while, it may be worthwhile having an attorney prepare these documents together and save on some costs.       Separate significant issues discussed: Insomnia-prior trials of melatonin, CBD oil, Xanax.  Neither really helped and he had intolerances to those.  He wants to try Ambien.  We discussed risk and benefits of medication.  Trial of Ambien.  Discussed using this sparingly  Muscle spasm upper back-I recommend heat, stretching, Aleve or ibuprofen for the next few days, recheck if not improving  Nail deformity-consider dermatology  consult    Bartolo was seen today for fasting cpe.  Diagnoses and all orders for this visit:  Encounter for health maintenance examination in adult -     Comprehensive metabolic panel -     CBC -     Lipid panel -     Hemoglobin A1c -     Ambulatory referral to Gastroenterology -     PSA -     CT CARDIAC SCORING ([facility] LOCATIONS ONLY); Future  Family history of heart disease  Varicose veins of right lower extremity, unspecified whether complicated  S/P LASIK surgery  Seasonal allergic rhinitis due to pollen  Insomnia, unspecified type  Screening for prostate cancer -     PSA  Screen for colon cancer -     Ambulatory referral to Gastroenterology  Screening for diabetes mellitus -     Hemoglobin A1c  Nail deformity  Muscle spasm  Screening for heart disease -     CT CARDIAC SCORING ([facility] LOCATIONS ONLY); Future   Follow-up pending labs, yearly for physical

## 2021-07-05 NOTE — Patient Instructions (Signed)
Vaccines: Shingles vaccine:  I recommend you have a shingles vaccine to help prevent shingles or herpes zoster outbreak.   Please call your insurer to inquire about coverage for the Shingrix vaccine given in 2 doses.   Some insurers cover this vaccine after age 50, some cover this after age 43.  If your insurer covers this, then call to schedule appointment to have this vaccine here.  Also check insurance about whether you can get a pneumococcal 23 vaccine.   You don't particularly have a health condition such as heart disease, diabetes, lung disease, etc., but you are welcome to get pneumococcal vaccine if insurance covers this.  Call insurance about coverage for Cologuard and Colonoscopy screening. Also ask if you do Cologuard and its positive, will they go ahead and cover a diagnostic colonoscopy?

## 2021-07-06 ENCOUNTER — Other Ambulatory Visit: Payer: Self-pay | Admitting: Medical

## 2021-07-06 LAB — CBC
Hematocrit: 45.2 % (ref 37.5–51.0)
Hemoglobin: 15 g/dL (ref 13.0–17.7)
MCH: 28.7 pg (ref 26.6–33.0)
MCHC: 33.2 g/dL (ref 31.5–35.7)
MCV: 87 fL (ref 79–97)
Platelets: 416 10*3/uL (ref 150–450)
RBC: 5.22 x10E6/uL (ref 4.14–5.80)
RDW: 12.1 % (ref 11.6–15.4)
WBC: 6.3 10*3/uL (ref 3.4–10.8)

## 2021-07-06 LAB — COMPREHENSIVE METABOLIC PANEL
ALT: 22 IU/L (ref 0–44)
AST: 23 IU/L (ref 0–40)
Albumin/Globulin Ratio: 2.3 — ABNORMAL HIGH (ref 1.2–2.2)
Albumin: 5.2 g/dL — ABNORMAL HIGH (ref 4.0–5.0)
Alkaline Phosphatase: 64 IU/L (ref 44–121)
BUN/Creatinine Ratio: 14 (ref 9–20)
BUN: 14 mg/dL (ref 6–24)
Bilirubin Total: 0.5 mg/dL (ref 0.0–1.2)
CO2: 27 mmol/L (ref 20–29)
Calcium: 10.2 mg/dL (ref 8.7–10.2)
Chloride: 100 mmol/L (ref 96–106)
Creatinine, Ser: 1 mg/dL (ref 0.76–1.27)
Globulin, Total: 2.3 g/dL (ref 1.5–4.5)
Glucose: 104 mg/dL — ABNORMAL HIGH (ref 70–99)
Potassium: 4.7 mmol/L (ref 3.5–5.2)
Sodium: 140 mmol/L (ref 134–144)
Total Protein: 7.5 g/dL (ref 6.0–8.5)
eGFR: 92 mL/min/{1.73_m2} (ref >59)

## 2021-07-06 LAB — PSA: Prostate Specific Ag, Serum: 0.8 ng/mL (ref 0.0–4.0)

## 2021-07-06 LAB — LIPID PANEL
Chol/HDL Ratio: 3.8 ratio (ref 0.0–5.0)
Cholesterol, Total: 233 mg/dL — ABNORMAL HIGH (ref 100–199)
HDL: 62 mg/dL (ref >39)
LDL Chol Calc (NIH): 156 mg/dL — ABNORMAL HIGH (ref 0–99)
Triglycerides: 86 mg/dL (ref 0–149)
VLDL Cholesterol Cal: 15 mg/dL (ref 5–40)

## 2021-07-06 LAB — HEMOGLOBIN A1C
Est. average glucose Bld gHb Est-mCnc: 117 mg/dL
Hgb A1c MFr Bld: 5.7 % — ABNORMAL HIGH (ref 4.8–5.6)

## 2021-07-06 MED ORDER — ZOLPIDEM TARTRATE 10 MG PO TABS: 10.0000 mg | ORAL_TABLET | Freq: Every evening | ORAL | 0 refills | Status: DC | PRN

## 2021-08-17 ENCOUNTER — Other Ambulatory Visit: Payer: Self-pay | Admitting: Internal Medicine

## 2021-08-17 NOTE — Progress Notes (Signed)
Pt advised me that he wants to be referred to Cologuard so I have put this order in ?

## 2021-09-07 ENCOUNTER — Other Ambulatory Visit: Payer: Self-pay | Admitting: Family Medicine

## 2021-09-07 MED ORDER — ZOLPIDEM TARTRATE 10 MG PO TABS: 10.0000 mg | ORAL_TABLET | Freq: Every evening | ORAL | 0 refills | Status: DC | PRN

## 2021-10-17 ENCOUNTER — Other Ambulatory Visit: Payer: Self-pay | Admitting: Family Medicine

## 2021-10-17 MED ORDER — ZOLPIDEM TARTRATE 10 MG PO TABS: 10.0000 mg | ORAL_TABLET | Freq: Every evening | ORAL | 2 refills | Status: DC | PRN

## 2021-12-07 ENCOUNTER — Other Ambulatory Visit: Payer: Self-pay | Admitting: Family Medicine

## 2021-12-07 ENCOUNTER — Other Ambulatory Visit (HOSPITAL_COMMUNITY): Payer: Self-pay

## 2021-12-07 MED ORDER — ZOLPIDEM TARTRATE 10 MG PO TABS
10.0000 mg | ORAL_TABLET | Freq: Every evening | ORAL | 1 refills | Status: DC | PRN
  Filled 2021-12-07: qty 30, 30d supply, fill #0
  Filled 2021-12-11: qty 15, 15d supply, fill #0
  Filled 2022-01-21: qty 15, 15d supply, fill #1
  Filled 2022-03-06: qty 15, 15d supply, fill #2
  Filled 2022-04-11: qty 15, 15d supply, fill #3

## 2021-12-11 ENCOUNTER — Other Ambulatory Visit (HOSPITAL_COMMUNITY): Payer: Self-pay

## 2021-12-12 ENCOUNTER — Other Ambulatory Visit (HOSPITAL_COMMUNITY): Payer: Self-pay

## 2021-12-13 ENCOUNTER — Other Ambulatory Visit (HOSPITAL_COMMUNITY): Payer: Self-pay

## 2022-01-22 ENCOUNTER — Other Ambulatory Visit (HOSPITAL_COMMUNITY): Payer: Self-pay

## 2022-02-07 ENCOUNTER — Encounter: Payer: Self-pay | Admitting: Internal Medicine

## 2022-02-27 ENCOUNTER — Ambulatory Visit: Payer: BC Managed Care – PPO | Admitting: Medical

## 2022-02-27 ENCOUNTER — Other Ambulatory Visit (HOSPITAL_COMMUNITY): Payer: Self-pay

## 2022-02-27 VITALS — BP 120/70 | HR 92 | Temp 98.6°F | Resp 16 | Wt 192.4 lb

## 2022-02-27 DIAGNOSIS — J988 Other specified respiratory disorders: Secondary | ICD-10-CM | POA: Diagnosis not present

## 2022-02-27 DIAGNOSIS — R058 Other specified cough: Secondary | ICD-10-CM

## 2022-02-27 MED ORDER — AZITHROMYCIN 250 MG PO TABS
ORAL_TABLET | ORAL | 0 refills | Status: DC
  Filled 2022-02-27: qty 6, 5d supply, fill #0

## 2022-02-27 MED ORDER — BENZONATATE 200 MG PO CAPS
200.0000 mg | ORAL_CAPSULE | Freq: Three times a day (TID) | ORAL | 0 refills | Status: DC | PRN
  Filled 2022-02-27: qty 30, 10d supply, fill #0

## 2022-02-27 NOTE — Progress Notes (Signed)
Subjective:  Manuel Johnson is a 51 y.o. male who presents for Chief Complaint  Patient presents with   chest cold    Chest congestion- coughing up gray/brown mucous, getting up in the morning is worse for mucous, feels wheezy     Here for chest cold.  He notes chest congestion, cough, lots of mucous, productive mucous, weak feeling.   Been present for 2 weeks, not resolving.  Not SOB or wheezy.  Wet cough.  Had covid test that was negative.   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   Nonsmoker.   Has had some sore throat, no fever, no body ache or chills.  Using mucinex OTC.  No other aggravating or relieving factors.    No other c/o.  The following portions of the patient's history were reviewed and updated as appropriate: allergies, current medications, past family history, past medical history, past social history, past surgical history and problem list.  ROS Otherwise as in subjective above    Objective: BP 120/70   Pulse 92   Temp 98.6 F (37 C)   Resp 16   Wt 192 lb 6.4 oz (87.3 kg)   SpO2 97%   BMI 26.09 kg/m   General appearance: alert, no distress, well developed, well nourished HEENT: normocephalic, sclerae anicteric, conjunctiva pink and moist, TMsflat, nares with erythema, swolend turbinates, mucoid discharge, pharynx normal Oral cavity: MMM, no lesions Neck: supple, no lymphadenopathy, no thyromegaly, no masses Heart: RRR, normal S1, S2, no murmurs Lungs: CTA bilaterally, no wheezes, rhonchi, or rales    Assessment: Encounter Diagnoses  Name Primary?   Cough productive of purulent sputum Yes   Respiratory tract infection      Plan: Discussed symptoms and concerns.  Continue good hydration, continue mucinex plain 3-5 more days, add medications below . If not resolving or much improved within 3 days, call or recheck.   Manuel Johnson was seen today for chest cold.  Diagnoses and all orders for this visit:  Cough productive of purulent  sputum  Respiratory tract infection  Other orders -     benzonatate (TESSALON) 200 MG capsule; Take 1 capsule (200 mg total) by mouth 3 (three) times daily as needed for cough. -     azithromycin (ZITHROMAX) 250 MG tablet; 2 tablets day 1, then 1 tablet days 2-4   Follow up: prn

## 2022-03-06 ENCOUNTER — Other Ambulatory Visit (HOSPITAL_COMMUNITY): Payer: Self-pay

## 2022-03-26 ENCOUNTER — Encounter: Payer: Self-pay | Admitting: Medical

## 2022-04-11 ENCOUNTER — Other Ambulatory Visit (HOSPITAL_COMMUNITY): Payer: Self-pay

## 2022-05-21 ENCOUNTER — Other Ambulatory Visit: Payer: Self-pay | Admitting: Family Medicine

## 2022-05-21 MED ORDER — ZOLPIDEM TARTRATE 10 MG PO TABS
10.0000 mg | ORAL_TABLET | Freq: Every evening | ORAL | 1 refills | Status: DC | PRN
  Filled 2022-05-21: qty 30, 30d supply, fill #0

## 2022-05-22 ENCOUNTER — Other Ambulatory Visit (HOSPITAL_COMMUNITY): Payer: Self-pay

## 2022-05-22 ENCOUNTER — Other Ambulatory Visit: Payer: Self-pay

## 2022-07-13 ENCOUNTER — Ambulatory Visit: Payer: BC Managed Care – PPO | Admitting: Medical

## 2022-07-13 ENCOUNTER — Other Ambulatory Visit (HOSPITAL_COMMUNITY): Payer: Self-pay

## 2022-07-13 VITALS — BP 122/82 | HR 64 | Ht 73.0 in | Wt 195.2 lb

## 2022-07-13 DIAGNOSIS — J301 Allergic rhinitis due to pollen: Secondary | ICD-10-CM

## 2022-07-13 DIAGNOSIS — Z0000 Encounter for general adult medical examination without abnormal findings: Secondary | ICD-10-CM | POA: Diagnosis not present

## 2022-07-13 DIAGNOSIS — G4700 Insomnia, unspecified: Secondary | ICD-10-CM

## 2022-07-13 DIAGNOSIS — Z131 Encounter for screening for diabetes mellitus: Secondary | ICD-10-CM

## 2022-07-13 DIAGNOSIS — Z8249 Family history of ischemic heart disease and other diseases of the circulatory system: Secondary | ICD-10-CM

## 2022-07-13 DIAGNOSIS — Z1211 Encounter for screening for malignant neoplasm of colon: Secondary | ICD-10-CM

## 2022-07-13 DIAGNOSIS — Z125 Encounter for screening for malignant neoplasm of prostate: Secondary | ICD-10-CM

## 2022-07-13 DIAGNOSIS — Z9889 Other specified postprocedural states: Secondary | ICD-10-CM

## 2022-07-13 DIAGNOSIS — Z13220 Encounter for screening for lipoid disorders: Secondary | ICD-10-CM

## 2022-07-13 DIAGNOSIS — L609 Nail disorder, unspecified: Secondary | ICD-10-CM

## 2022-07-13 MED ORDER — CLOTRIMAZOLE-BETAMETHASONE 1-0.05 % EX CREA
1.0000 | TOPICAL_CREAM | Freq: Every day | CUTANEOUS | 0 refills | Status: DC
  Filled 2022-07-13: qty 30, 30d supply, fill #0

## 2022-07-13 MED ORDER — ZOLPIDEM TARTRATE 10 MG PO TABS
10.0000 mg | ORAL_TABLET | Freq: Every evening | ORAL | 1 refills | Status: DC | PRN
  Filled 2022-07-13: qty 15, 15d supply, fill #0
  Filled 2022-09-03: qty 15, 15d supply, fill #1
  Filled 2022-10-02: qty 15, 15d supply, fill #2
  Filled 2022-11-08: qty 15, 15d supply, fill #3
  Filled 2022-12-24: qty 15, 15d supply, fill #4

## 2022-07-13 NOTE — Progress Notes (Signed)
Subjective:   HPI  Manuel Johnson is a 52 y.o. male who presents for Chief Complaint  Patient presents with   fasting cpe    Fasting cpe, no concerns    Patient Care Team: Annis Lagoy, Leward Quan as PCP - General (Family Medicine) Sees dentist Sees eye doctor  Concerns: He has a history of insomnia.  Does well on Ambien  Has nail issues.  Bites nails in general, but a few nails are unsightly.    Reviewed their medical, surgical, family, social, medication, and allergy history and updated chart as appropriate.  Past Medical History:  Diagnosis Date   Bulging of cervical intervertebral disc    causing left arm pain and weakness per pt   Insomnia 2015   Insomnia    S/P LASIK surgery    Seasonal allergies     Past Surgical History:  Procedure Laterality Date   ANTERIOR CERVICAL DECOMP/DISCECTOMY FUSION N/A 09/09/2019   Procedure: ANTERIOR CERVICAL DECOMPRESSION/DISCECTOMY FUSION C7-T1;  Surgeon: Melina Schools, MD;  Location: [facility];  Service: Orthopedics;  Laterality: N/A;  2.5 hrs   EYE SURGERY     lasik   LASIK  2006    Family History  Problem Relation Age of Onset   Alzheimer's disease Mother    Heart disease Mother        angina   Other Mother        died at 78yo from MVA   Alcohol abuse Father    Cancer Neg Hx    Stroke Neg Hx    Hypertension Neg Hx    Diabetes Neg Hx      Current Outpatient Medications:    aspirin EC 81 MG tablet, Take 81 mg by mouth daily. Swallow whole., Disp: , Rfl:    Calcium-Magnesium 200-50 MG TABS, Take 1 tablet by mouth daily., Disp: , Rfl:    clotrimazole-betamethasone (LOTRISONE) cream, Apply 1 Application topically daily., Disp: 30 g, Rfl: 0   co-enzyme Q-10 30 MG capsule, Take 30 mg by mouth daily., Disp: , Rfl:    Multiple Vitamin (MULTIVITAMIN) capsule, Take 1 capsule by mouth daily., Disp: , Rfl:    zolpidem (AMBIEN) 10 MG tablet, Take 1 tablet (10 mg total) by mouth at bedtime as needed for sleep., Disp: 90 tablet, Rfl:  1  Allergies  Allergen Reactions   Xanax [Alprazolam]     Made him feel sad, withdrawal type reaction   Prednisone Hives    Remote past, questionable allergy   Review of Systems  Constitutional:  Negative for chills, fever, malaise/fatigue and weight loss.  HENT:  Negative for congestion, ear pain, hearing loss, sore throat and tinnitus.   Eyes:  Negative for blurred vision, pain and redness.  Respiratory:  Negative for cough, hemoptysis and shortness of breath.   Cardiovascular:  Negative for chest pain, palpitations, orthopnea, claudication and leg swelling.  Gastrointestinal:  Negative for abdominal pain, blood in stool, constipation, diarrhea, nausea and vomiting.  Genitourinary:  Negative for dysuria, flank pain, frequency, hematuria and urgency.  Musculoskeletal:  Negative for falls, joint pain and myalgias.  Skin:  Negative for itching and rash.       Nail deformity  Neurological:  Negative for dizziness, tingling, speech change, weakness and headaches.  Endo/Heme/Allergies:  Negative for polydipsia. Does not bruise/bleed easily.  Psychiatric/Behavioral:  Negative for depression and memory loss. The patient is not nervous/anxious and does not have insomnia.         07/13/2022    8:24 AM  07/05/2021    9:31 AM 05/16/2021    1:42 PM 03/06/2019    1:53 PM 07/14/2018   11:17 AM  Depression screen PHQ 2/9  Decreased Interest 0 0 0 0 0  Down, Depressed, Hopeless 0 0 0 0 0  PHQ - 2 Score 0 0 0 0 0        Objective:  BP 122/82   Pulse 64   Ht 6' 1"$  (1.854 m)   Wt 195 lb 3.2 oz (88.5 kg)   BMI 25.75 kg/m   General appearance: alert, no distress, WD/WN, Caucasian male Skin: right ring finger and bilat thumb nails with receding and thickening, rough appearance, otherwise no worrisome nail or skin lesions HEENT: normocephalic, conjunctiva/corneas normal, sclerae anicteric, PERRLA, EOMi Neck: supple, no lymphadenopathy, no thyromegaly, no masses, normal ROM, no bruits Chest:  non tender, normal shape and expansion Heart: RRR, normal S1, S2, no murmurs Lungs: CTA bilaterally, no wheezes, rhonchi, or rales Abdomen: +bs, soft, non tender, non distended, no masses, no hepatomegaly, no splenomegaly, no bruits Back: Tender in the upper back paraspinal otherwise non tender, normal ROM, no scoliosis Musculoskeletal: upper extremities non tender, no obvious deformity, normal ROM throughout, lower extremities non tender, no obvious deformity, normal ROM throughout Extremities: no edema, no cyanosis, no clubbing Pulses: 2+ symmetric, upper and lower extremities, normal cap refill Neurological: alert, oriented x 3, CN2-12 intact, strength normal upper extremities and lower extremities, sensation normal throughout, DTRs 2+ throughout, no cerebellar signs, gait normal Psychiatric: normal affect, behavior normal, pleasant  GU/rectal - declined today   Assessment and Plan :   Encounter Diagnoses  Name Primary?   Encounter for health maintenance examination in adult Yes   Screening for prostate cancer    Screening for diabetes mellitus    Screen for colon cancer    S/P LASIK surgery    Insomnia, unspecified type    Family history of heart disease    Seasonal allergic rhinitis due to pollen    Screening for lipid disorders     This visit was a preventative care visit, also known as wellness visit or routine physical.   Topics typically include healthy lifestyle, diet, exercise, preventative care, vaccinations, sick and well care, proper use of emergency dept and after hours care, as well as other concerns.     Recommendations: Continue to return yearly for your annual wellness and preventative care visits.  This gives [place] a chance to discuss healthy lifestyle, exercise, vaccinations, review your chart record, and perform screenings where appropriate.  I recommend you see your eye doctor yearly for routine vision care.  I recommend you see your dentist yearly for routine  dental care including hygiene visits twice yearly.   Vaccination recommendations were reviewed Immunization History  Administered Date(s) Administered   Influenza,inj,Quad PF,6+ Mos 03/10/2014, 03/06/2019   Tdap 12/13/2014, 06/25/2020    Plan to return in summer 2024 for Shingrix vaccine, 2 doses 2 months apart   Screening for cancer: Colon cancer screening: Turn in the Cologuard soon!!!  We discussed PSA, prostate exam, and prostate cancer screening risks/benefits.     Skin cancer screening: Check your skin regularly for new changes, growing lesions, or other lesions of concern Come in for evaluation if you have skin lesions of concern.  Lung cancer screening: If you have a greater than 20 pack year history of tobacco use, then you may qualify for lung cancer screening with a chest CT scan.   Please call your insurance company to  inquire about coverage for this test.  We currently don't have screenings for other cancers besides breast, cervical, colon, and lung cancers.  If you have a strong family history of cancer or have other cancer screening concerns, please let me know.    Bone health: Get at least 150 minutes of aerobic exercise weekly Get weight bearing exercise at least once weekly Bone density test:  A bone density test is an imaging test that uses a type of X-ray to measure the amount of calcium and other minerals in your bones. The test may be used to diagnose or screen you for a condition that causes weak or thin bones (osteoporosis), predict your risk for a broken bone (fracture), or determine how well your osteoporosis treatment is working. The bone density test is recommended for females 3 and older, or females or males [id number] if certain risk factors such as thyroid disease, long term use of steroids such as for asthma or rheumatological issues, vitamin D deficiency, estrogen deficiency, family history of osteoporosis, self or family history of fragility fracture in  first degree relative.    Heart health: Get at least 150 minutes of aerobic exercise weekly Limit alcohol It is important to maintain a healthy blood pressure and healthy cholesterol numbers  Heart disease screening: Screening for heart disease includes screening for blood pressure, fasting lipids, glucose/diabetes screening, BMI height to weight ratio, reviewed of smoking status, physical activity, and diet.    Goals include blood pressure 120/80 or less, maintaining a healthy lipid/cholesterol profile, preventing diabetes or keeping diabetes numbers under good control, not smoking or using tobacco products, exercising most days per week or at least 150 minutes per week of exercise, and eating healthy variety of fruits and vegetables, healthy oils, and avoiding unhealthy food choices like fried food, fast food, high sugar and high cholesterol foods.    Other tests may possibly include EKG test, CT coronary calcium score, echocardiogram, exercise treadmill stress test.   Consider CT coronary calcium score, $95 cash    Medical care options: I recommend you continue to seek care here first for routine care.  We try really hard to have available appointments Monday through Friday daytime hours for sick visits, acute visits, and physicals.  Urgent care should be used for after hours and weekends for significant issues that cannot wait till the next day.  The emergency department should be used for significant potentially life-threatening emergencies.  The emergency department is expensive, can often have long wait times for less significant concerns, so try to utilize primary care, urgent care, or telemedicine when possible to avoid unnecessary trips to the emergency department.  Virtual visits and telemedicine have been introduced since the pandemic started in 2020, and can be convenient ways to receive medical care.  We offer virtual appointments as well to assist you in a variety of options to  seek medical care.   Advanced Directives: I recommend you consider completing a [place] and Living Will.   These documents respect your wishes and help alleviate burdens on your loved ones if you were to become terminally ill or be in a position to need those documents enforced.    You can complete Advanced Directives yourself, have them notarized, then have copies made for our office, for you and for anybody you feel should have them in safe keeping.  Or, you can have an attorney prepare these documents.   If you haven't updated your Last Will and Testament in  a while, it may be worthwhile having an attorney prepare these documents together and save on some costs.       Separate significant issues discussed: Insomnia-does fine on Ambien, he is aware of risks/benefits  Nail deformity Consider dermatology consult Consider oral antifungal medicaiton For now you wanted to try a cream, so lets try Lotrisone cream for 2-3 weeks to see if any improvement Consider OTC lotion for thumb sucking that may discourage biting   Avant was seen today for fasting cpe.  Diagnoses and all orders for this visit:  Encounter for health maintenance examination in adult -     PSA -     Comprehensive metabolic panel -     CBC -     Lipid panel -     Hemoglobin A1c  Screening for prostate cancer -     PSA  Screening for diabetes mellitus -     Hemoglobin A1c  Screen for colon cancer  S/P LASIK surgery  Insomnia, unspecified type  Family history of heart disease  Seasonal allergic rhinitis due to pollen  Screening for lipid disorders -     Lipid panel  Other orders -     clotrimazole-betamethasone (LOTRISONE) cream; Apply 1 Application topically daily. -     zolpidem (AMBIEN) 10 MG tablet; Take 1 tablet (10 mg total) by mouth at bedtime as needed for sleep.   Follow-up pending labs, yearly for physical

## 2022-07-13 NOTE — Patient Instructions (Signed)
This visit was a preventative care visit, also known as wellness visit or routine physical.   Topics typically include healthy lifestyle, diet, exercise, preventative care, vaccinations, sick and well care, proper use of emergency dept and after hours care, as well as other concerns.     Recommendations: Continue to return yearly for your annual wellness and preventative care visits.  This gives [place] a chance to discuss healthy lifestyle, exercise, vaccinations, review your chart record, and perform screenings where appropriate.  I recommend you see your eye doctor yearly for routine vision care.  I recommend you see your dentist yearly for routine dental care including hygiene visits twice yearly.   Vaccination recommendations were reviewed Immunization History  Administered Date(s) Administered   Influenza,inj,Quad PF,6+ Mos 03/10/2014, 03/06/2019   Tdap 12/13/2014, 06/25/2020    Plan to return in summer 2024 for Shingrix vaccine, 2 doses 2 months apart   Screening for cancer: Colon cancer screening: Turn in the Cologuard soon!!!  We discussed PSA, prostate exam, and prostate cancer screening risks/benefits.     Skin cancer screening: Check your skin regularly for new changes, growing lesions, or other lesions of concern Come in for evaluation if you have skin lesions of concern.  Lung cancer screening: If you have a greater than 20 pack year history of tobacco use, then you may qualify for lung cancer screening with a chest CT scan.   Please call your insurance company to inquire about coverage for this test.  We currently don't have screenings for other cancers besides breast, cervical, colon, and lung cancers.  If you have a strong family history of cancer or have other cancer screening concerns, please let me know.    Bone health: Get at least 150 minutes of aerobic exercise weekly Get weight bearing exercise at least once weekly Bone density test:  A bone density test  is an imaging test that uses a type of X-ray to measure the amount of calcium and other minerals in your bones. The test may be used to diagnose or screen you for a condition that causes weak or thin bones (osteoporosis), predict your risk for a broken bone (fracture), or determine how well your osteoporosis treatment is working. The bone density test is recommended for females 47 and older, or females or males [id number] if certain risk factors such as thyroid disease, long term use of steroids such as for asthma or rheumatological issues, vitamin D deficiency, estrogen deficiency, family history of osteoporosis, self or family history of fragility fracture in first degree relative.    Heart health: Get at least 150 minutes of aerobic exercise weekly Limit alcohol It is important to maintain a healthy blood pressure and healthy cholesterol numbers  Heart disease screening: Screening for heart disease includes screening for blood pressure, fasting lipids, glucose/diabetes screening, BMI height to weight ratio, reviewed of smoking status, physical activity, and diet.    Goals include blood pressure 120/80 or less, maintaining a healthy lipid/cholesterol profile, preventing diabetes or keeping diabetes numbers under good control, not smoking or using tobacco products, exercising most days per week or at least 150 minutes per week of exercise, and eating healthy variety of fruits and vegetables, healthy oils, and avoiding unhealthy food choices like fried food, fast food, high sugar and high cholesterol foods.    Other tests may possibly include EKG test, CT coronary calcium score, echocardiogram, exercise treadmill stress test.   Consider CT coronary calcium score, $95 cash    Medical care options: I  recommend you continue to seek care here first for routine care.  We try really hard to have available appointments Monday through Friday daytime hours for sick visits, acute visits, and physicals.  Urgent  care should be used for after hours and weekends for significant issues that cannot wait till the next day.  The emergency department should be used for significant potentially life-threatening emergencies.  The emergency department is expensive, can often have long wait times for less significant concerns, so try to utilize primary care, urgent care, or telemedicine when possible to avoid unnecessary trips to the emergency department.  Virtual visits and telemedicine have been introduced since the pandemic started in 2020, and can be convenient ways to receive medical care.  We offer virtual appointments as well to assist you in a variety of options to seek medical care.   Advanced Directives: I recommend you consider completing a [place] and Living Will.   These documents respect your wishes and help alleviate burdens on your loved ones if you were to become terminally ill or be in a position to need those documents enforced.    You can complete Advanced Directives yourself, have them notarized, then have copies made for our office, for you and for anybody you feel should have them in safe keeping.  Or, you can have an attorney prepare these documents.   If you haven't updated your Last Will and Testament in a while, it may be worthwhile having an attorney prepare these documents together and save on some costs.       Separate significant issues discussed: Insomnia-does fine on Ambien, he is aware of risks/benefits  Nail deformity Consider dermatology consult Consider oral antifungal medicaiton For now you wanted to try a cream, so lets try Lotrisone cream for 2-3 weeks to see if any improvement Consider OTC lotion for thumb sucking that may discourage biting

## 2022-07-14 LAB — COMPREHENSIVE METABOLIC PANEL
ALT: 35 IU/L (ref 0–44)
AST: 27 IU/L (ref 0–40)
Albumin/Globulin Ratio: 2 (ref 1.2–2.2)
Albumin: 4.8 g/dL (ref 3.8–4.9)
Alkaline Phosphatase: 63 IU/L (ref 44–121)
BUN/Creatinine Ratio: 15 (ref 9–20)
BUN: 15 mg/dL (ref 6–24)
Bilirubin Total: 0.4 mg/dL (ref 0.0–1.2)
CO2: 22 mmol/L (ref 20–29)
Calcium: 9.9 mg/dL (ref 8.7–10.2)
Chloride: 99 mmol/L (ref 96–106)
Creatinine, Ser: 1 mg/dL (ref 0.76–1.27)
Globulin, Total: 2.4 g/dL (ref 1.5–4.5)
Glucose: 99 mg/dL (ref 70–99)
Potassium: 5 mmol/L (ref 3.5–5.2)
Sodium: 136 mmol/L (ref 134–144)
Total Protein: 7.2 g/dL (ref 6.0–8.5)
eGFR: 91 mL/min/{1.73_m2} (ref >59)

## 2022-07-14 LAB — CBC
Hematocrit: 44.4 % (ref 37.5–51.0)
Hemoglobin: 14.9 g/dL (ref 13.0–17.7)
MCH: 29.6 pg (ref 26.6–33.0)
MCHC: 33.6 g/dL (ref 31.5–35.7)
MCV: 88 fL (ref 79–97)
Platelets: 379 10*3/uL (ref 150–450)
RBC: 5.03 x10E6/uL (ref 4.14–5.80)
RDW: 12.4 % (ref 11.6–15.4)
WBC: 5.9 10*3/uL (ref 3.4–10.8)

## 2022-07-14 LAB — PSA: Prostate Specific Ag, Serum: 0.7 ng/mL (ref 0.0–4.0)

## 2022-07-14 LAB — LIPID PANEL
Chol/HDL Ratio: 3.3 ratio (ref 0.0–5.0)
Cholesterol, Total: 212 mg/dL — ABNORMAL HIGH (ref 100–199)
HDL: 65 mg/dL (ref >39)
LDL Chol Calc (NIH): 132 mg/dL — ABNORMAL HIGH (ref 0–99)
Triglycerides: 83 mg/dL (ref 0–149)
VLDL Cholesterol Cal: 15 mg/dL (ref 5–40)

## 2022-07-14 LAB — HEMOGLOBIN A1C
Est. average glucose Bld gHb Est-mCnc: 117 mg/dL
Hgb A1c MFr Bld: 5.7 % — ABNORMAL HIGH (ref 4.8–5.6)

## 2022-07-16 ENCOUNTER — Other Ambulatory Visit: Payer: Self-pay | Admitting: Medical

## 2022-07-16 DIAGNOSIS — Z136 Encounter for screening for cardiovascular disorders: Secondary | ICD-10-CM

## 2022-07-16 DIAGNOSIS — E785 Hyperlipidemia, unspecified: Secondary | ICD-10-CM

## 2022-07-16 NOTE — Progress Notes (Signed)
Cholesterol still little higher than I would like.  You are still prediabetic but stable.  Prostate lab okay, kidney electrolytes and liver are okay, blood counts okay  If agreeable to the CT chest test heart test, I recommend this to further evaluate how big a deal your cholesterol may be

## 2022-07-18 ENCOUNTER — Other Ambulatory Visit (HOSPITAL_COMMUNITY): Payer: Self-pay

## 2022-07-20 ENCOUNTER — Telehealth: Payer: Self-pay | Admitting: Medical

## 2022-07-20 NOTE — Telephone Encounter (Signed)
CT Appointment

## 2022-07-26 ENCOUNTER — Ambulatory Visit (HOSPITAL_COMMUNITY): Admission: RE | Admit: 2022-07-26 | Payer: BC Managed Care – PPO | Source: Ambulatory Visit

## 2022-08-02 IMAGING — CT CT CERVICAL SPINE W/O CM
3 of 4 series · 12 of 33 positions shown, 14 images · non-contrast
Comparison: None.

CLINICAL DATA: Syncope, head laceration

EXAM:
CT HEAD WITHOUT CONTRAST
CT CERVICAL SPINE WITHOUT CONTRAST
TECHNIQUE: Multidetector CT imaging of the head and cervical spine was
performed following the standard protocol without intravenous
contrast. Multiplanar CT image reconstructions of the cervical spine
were also generated.

[Series 6: orthogonal bone · axial · 0.23mm/px · z∈[+1035,+1181]mm · 4 of 112 slices shown, 5 images]
[im 16/112  soft-tissue]
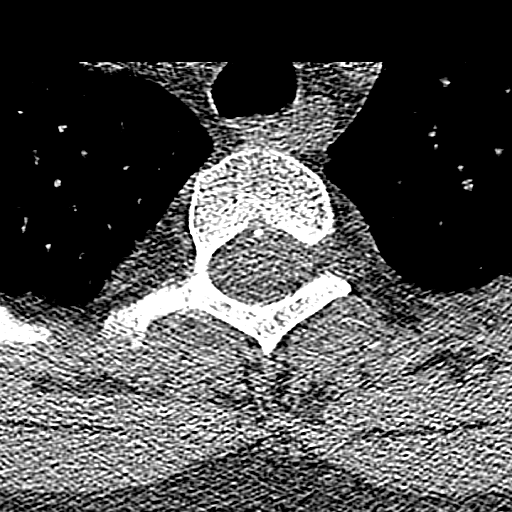
[im 16/112  bone]
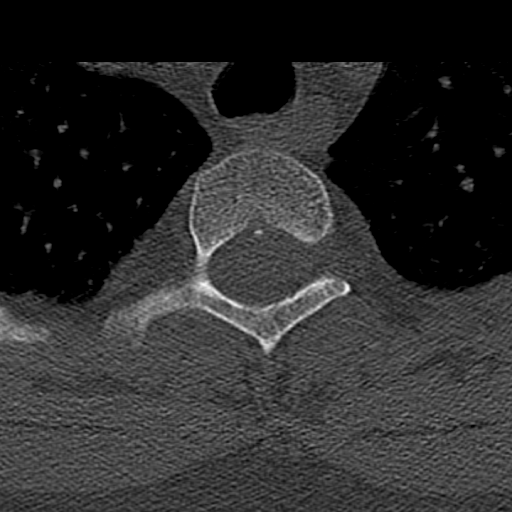
[im 48/112  bone]
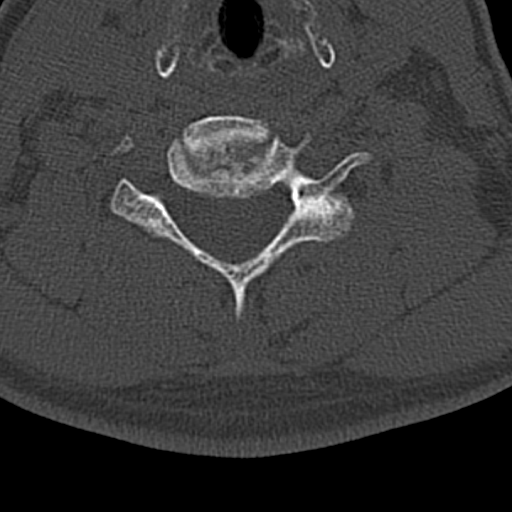
[im 64/112  bone]
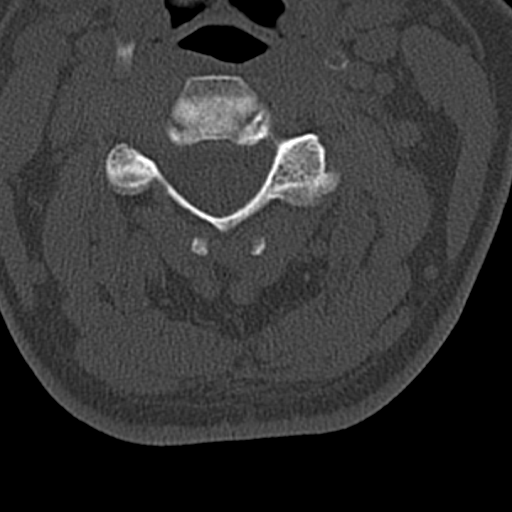
[im 96/112  bone]
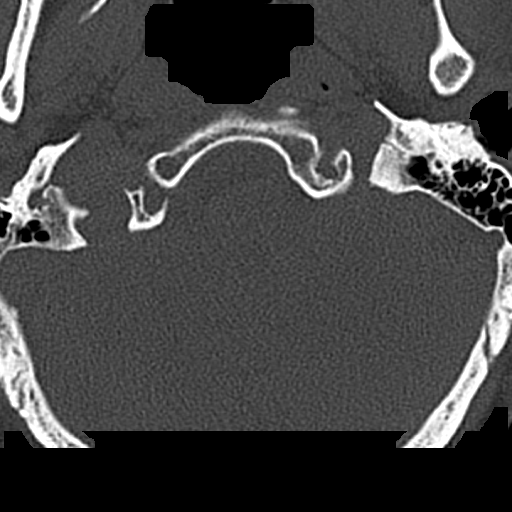

[Series 7: coronal bone · coronal · 0.26mm/px · 3 of 61 slices shown]
[im 14/61  bone]
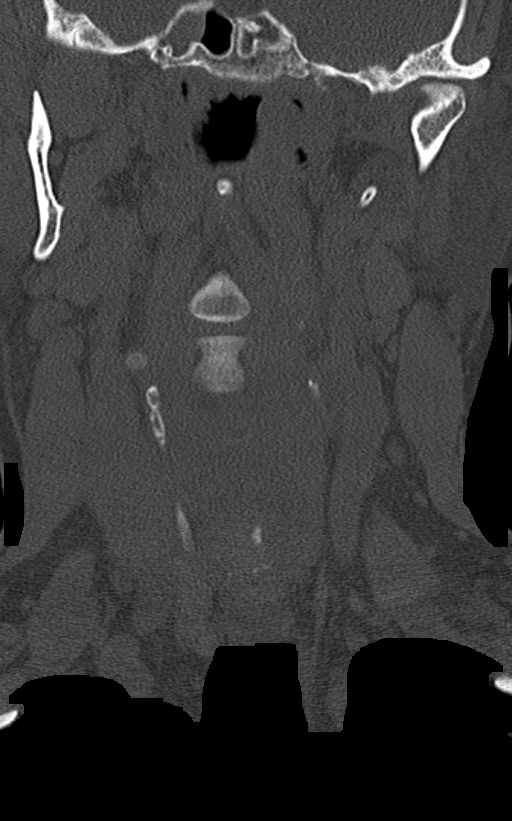
[im 25/61  bone]
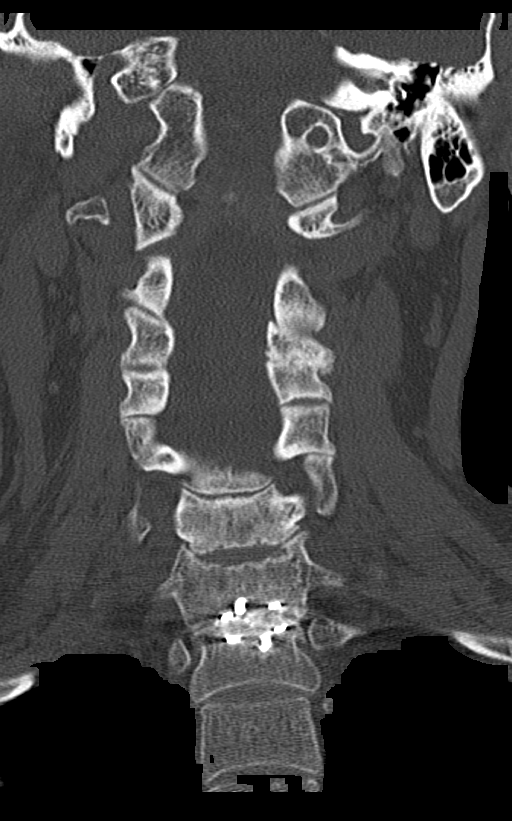
[im 36/61  bone]
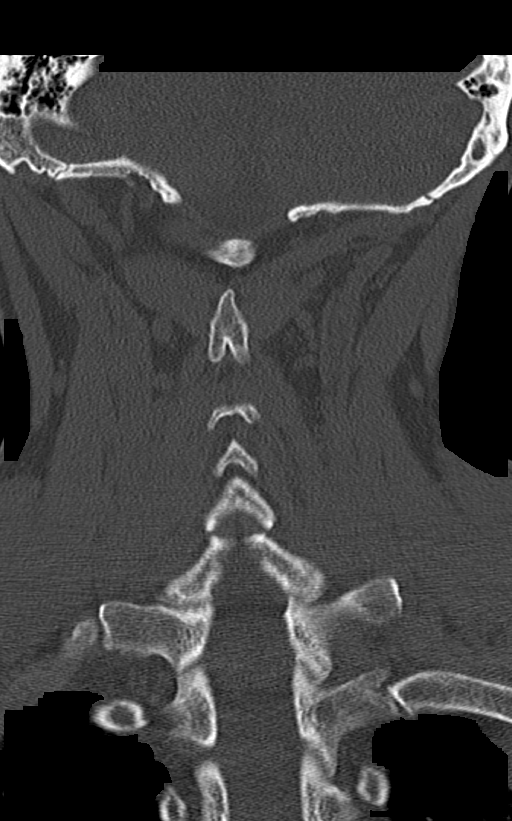

[Series 8: sagittal bone · sagittal · 0.26mm/px · 5 of 61 slices shown, 6 images]
[im 21/61  bone]
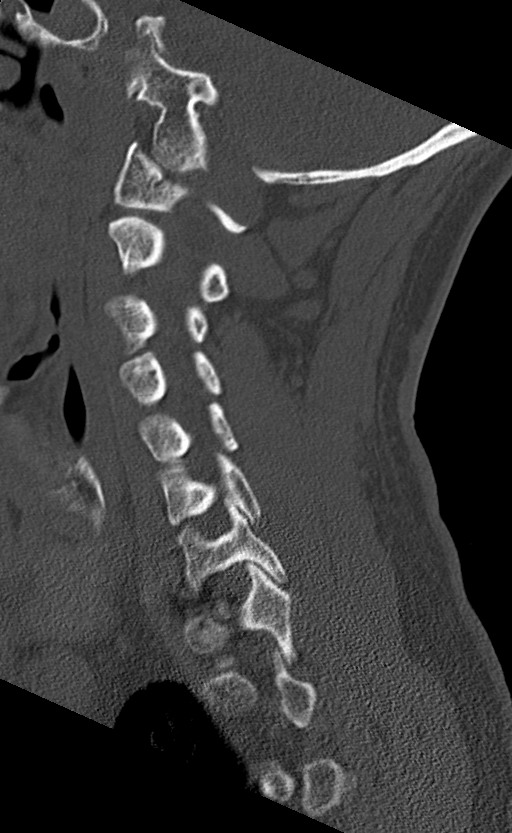
[im 26/61  bone]
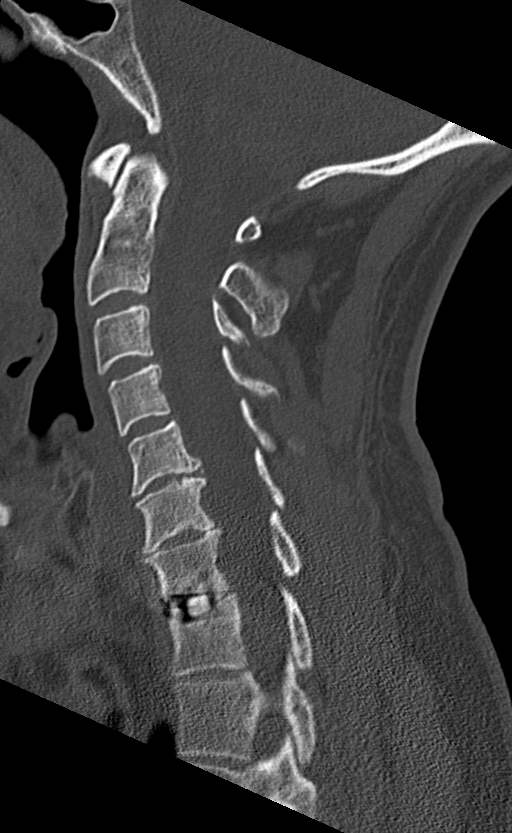
[im 31/61  soft-tissue]
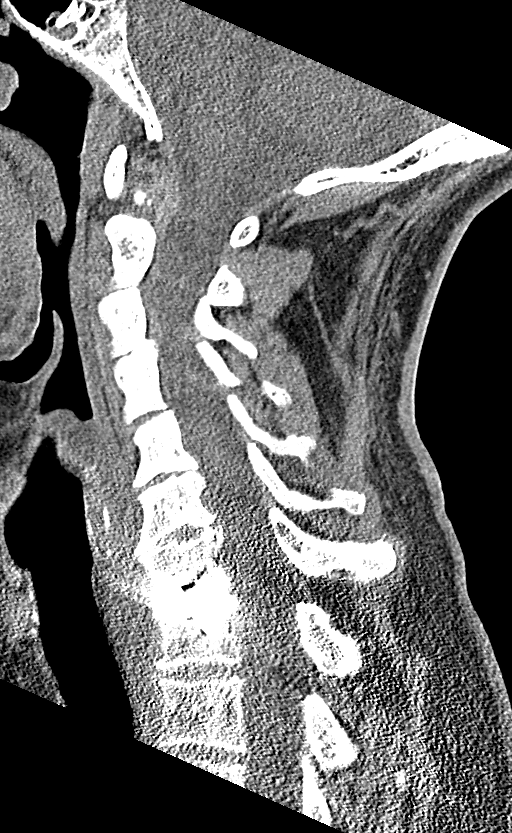
[im 31/61  bone]
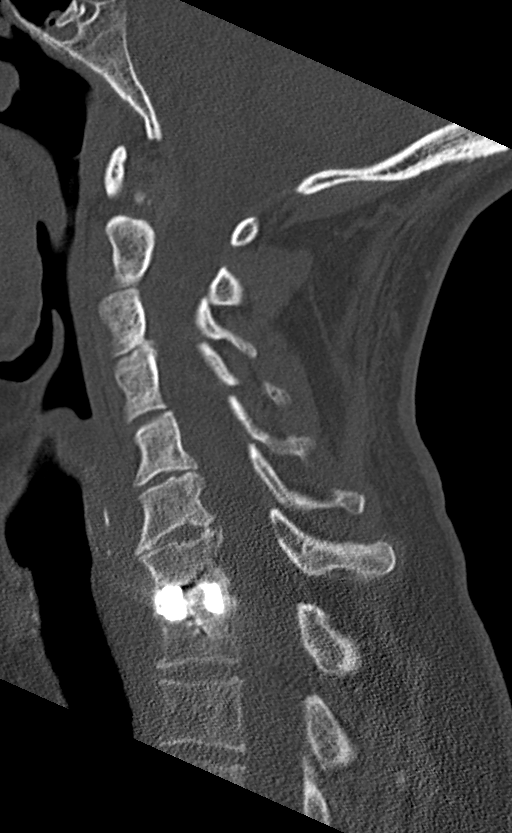
[im 36/61  bone]
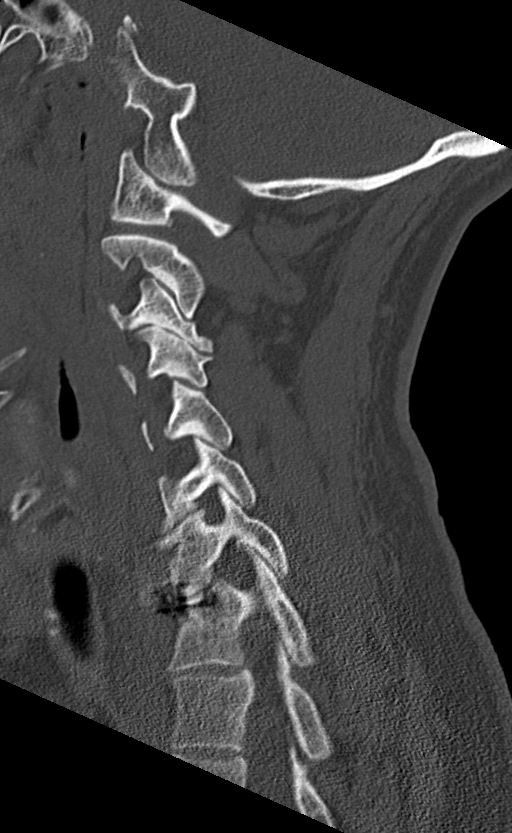
[im 41/61  bone]
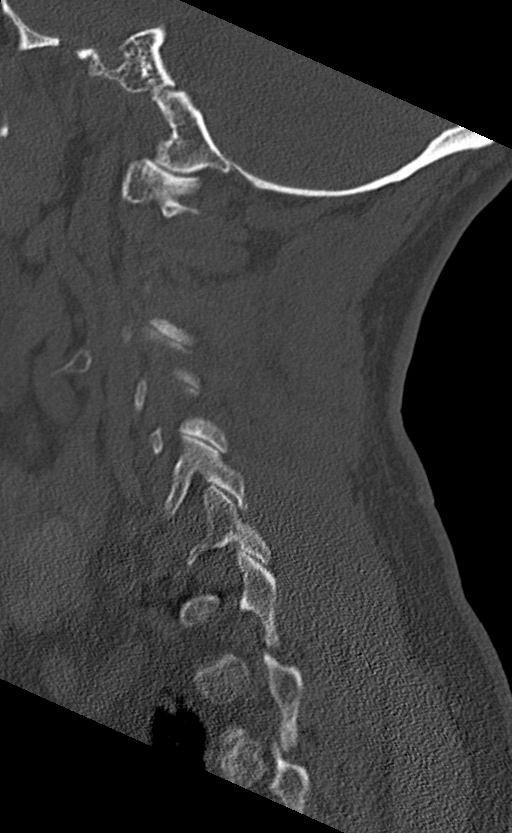

[12 of 33 positions shown; findings below may reference images not displayed]

FINDINGS: CT HEAD FINDINGS

Brain: No evidence of acute infarction, hemorrhage, hydrocephalus,
extra-axial collection or mass lesion/mass effect.

Vascular: No hyperdense vessel or unexpected calcification.

Skull: Normal. Negative for fracture or focal lesion.

Sinuses/Orbits: The visualized paranasal sinuses are essentially
clear. The mastoid air cells are unopacified.

Other: Soft tissue laceration along the right vertex (series 4/image
80).

CT CERVICAL SPINE FINDINGS

Alignment: Normal cervical lordosis.

Skull base and vertebrae: No acute fracture. No primary bone lesion
or focal pathologic process.

Soft tissues and spinal canal: No prevertebral fluid or swelling. No
visible canal hematoma.

Disc levels: Postsurgical changes at C7-T1. Very mild degenerative
changes of the lower cervical spine. Spinal canal is patent.

Upper chest: Very mild biapical pleural-parenchymal scarring. Lungs
are otherwise clear.

Other: Visualized thyroid is unremarkable.
IMPRESSION: Soft tissue laceration along the right vertex. No evidence of
calvarial fracture.

No evidence of acute intracranial abnormality.

No evidence of traumatic injury to the cervical spine. Postsurgical
changes at C7-T1. Very mild degenerative changes of the lower
cervical spine.

## 2022-09-04 ENCOUNTER — Other Ambulatory Visit: Payer: Self-pay

## 2022-10-02 ENCOUNTER — Other Ambulatory Visit: Payer: Self-pay

## 2022-11-08 ENCOUNTER — Other Ambulatory Visit: Payer: Self-pay

## 2022-11-15 ENCOUNTER — Encounter: Payer: Self-pay | Admitting: Medical

## 2022-11-15 ENCOUNTER — Telehealth (INDEPENDENT_AMBULATORY_CARE_PROVIDER_SITE_OTHER): Payer: BC Managed Care – PPO | Admitting: Medical

## 2022-11-15 ENCOUNTER — Other Ambulatory Visit (HOSPITAL_COMMUNITY): Payer: Self-pay

## 2022-11-15 VITALS — Wt 195.0 lb

## 2022-11-15 DIAGNOSIS — J019 Acute sinusitis, unspecified: Secondary | ICD-10-CM | POA: Diagnosis not present

## 2022-11-15 MED ORDER — BENZONATATE 200 MG PO CAPS
200.0000 mg | ORAL_CAPSULE | Freq: Three times a day (TID) | ORAL | 0 refills | Status: DC | PRN
  Filled 2022-11-15: qty 30, 10d supply, fill #0

## 2022-11-15 MED ORDER — AMOXICILLIN-POT CLAVULANATE 875-125 MG PO TABS
1.0000 | ORAL_TABLET | Freq: Two times a day (BID) | ORAL | 0 refills | Status: DC
  Filled 2022-11-15: qty 20, 10d supply, fill #0

## 2022-11-15 NOTE — Progress Notes (Signed)
Subjective:     Patient ID: Manuel Johnson, male   DOB: 1971-03-22, 52 y.o.   MRN: [id number]  This visit type was conducted due to national recommendations for restrictions regarding the COVID-19 Pandemic (e.g. social distancing) in an effort to limit this patient's exposure and mitigate transmission in our community.  Due to their co-morbid illnesses, this patient is at least at moderate risk for complications without adequate follow up.  This format is felt to be most appropriate for this patient at this time.    Documentation for virtual audio and video telecommunications through [place] encounter:  The patient was located at home. The provider was located in the office. The patient did consent to this visit and is aware of possible charges through their insurance for this visit.  The other persons participating in this telemedicine service were none. Time spent on call was 20 minutes and in review of previous records 20 minutes total.  This virtual service is not related to other E/M service within previous 7 days.   HPI Chief Complaint  Patient presents with   URI    Sinus infection- loads of pressure, coughing up mucous, runny nose, drainage, sore throat with coughing. Taking sudafed and flonase otc   Virtual for possible sinus infection.  He reports lots of head congestion, coughing up mucus.  Went from clear to yellow to now green.  Has pressure in ears, mucous drainage down throat in to chest, cough now.  Lack of energy.  Started over a week ago.  Has gradually worsened.  Using flonase, sudafed once daily for 3 days.  No fever, no body aches or chills.  Wife had recent illness that was viral infection. No other aggravating or relieving factors. No other complaint.   Past Medical History:  Diagnosis Date   Bulging of cervical intervertebral disc    causing left arm pain and weakness per pt   Insomnia 2015   Insomnia    S/P LASIK surgery    Seasonal allergies     Current Outpatient Medications on File Prior to Visit  Medication Sig Dispense Refill   aspirin EC 81 MG tablet Take 81 mg by mouth daily. Swallow whole.     Calcium-Magnesium 200-50 MG TABS Take 1 tablet by mouth daily.     clotrimazole-betamethasone (LOTRISONE) cream Apply to affected areas as directed daily. 30 g 0   co-enzyme Q-10 30 MG capsule Take 30 mg by mouth daily.     fluticasone (FLONASE) 50 MCG/ACT nasal spray SPRAY 2 SPRAYS INTO EACH NOSTRIL EVERY DAY     Multiple Vitamin (MULTIVITAMIN) capsule Take 1 capsule by mouth daily.     zolpidem (AMBIEN) 10 MG tablet Take 1 tablet (10 mg total) by mouth at bedtime as needed for sleep. 90 tablet 1   No current facility-administered medications on file prior to visit.     Review of Systems As in subjective    Objective:   Physical Exam Due to coronavirus pandemic stay at home measures, patient visit was virtual and they were not examined in person.   Wt 195 lb (88.5 kg)   BMI 25.73 kg/m   Gen: wd, wn, nad No labored breathing or wheezing     Assessment:     Encounter Diagnosis  Name Primary?   Acute sinusitis, recurrence not specified, unspecified location Yes       Plan:     Medications prescribed today: Augmentin antibiotic.   Make sure you complete the course of antibiotics.  Specific home care recommendations today include: Decongestant: You may use OTC Guaifenesin (Mucinex plain) for congestion.  You may use Pseudoephedrine (Sudafed) only if you don't have blood pressure problems or a diagnosis of hypertension. For worse cough, you can use Tessalon Perles cough syrup prescribed today.    Pain/fever relief: You may use over-the-counter Acetaminophen/Tylenol for pain or fever Consider nasal saline flush  Patient voiced understanding of plan and recommendations above.  Patient was advised to call or recheck if not much improved or worse in the next 4-5 days.   Manuel Johnson was seen today for uri.  Diagnoses  and all orders for this visit:  Acute sinusitis, recurrence not specified, unspecified location  Other orders -     benzonatate (TESSALON) 200 MG capsule; Take 1 capsule (200 mg total) by mouth 3 (three) times daily as needed for cough. -     amoxicillin-clavulanate (AUGMENTIN) 875-125 MG tablet; Take 1 tablet by mouth 2 (two) times daily.  F/u prn

## 2022-11-21 ENCOUNTER — Telehealth: Payer: Self-pay | Admitting: Medical

## 2022-11-21 NOTE — Telephone Encounter (Signed)
Pt called and wants a referral to colonoscopy. He says he did not do the coulagard you gave him he just wants to be referred. I told him you were out of the office this week and someone would get back to him once you return.

## 2022-11-23 ENCOUNTER — Other Ambulatory Visit: Payer: Self-pay | Admitting: Medical

## 2022-11-23 DIAGNOSIS — Z1211 Encounter for screening for malignant neoplasm of colon: Secondary | ICD-10-CM

## 2022-12-25 ENCOUNTER — Other Ambulatory Visit: Payer: Self-pay

## 2023-02-07 ENCOUNTER — Other Ambulatory Visit (HOSPITAL_COMMUNITY): Payer: Self-pay

## 2023-02-07 ENCOUNTER — Other Ambulatory Visit: Payer: Self-pay | Admitting: Medical

## 2023-02-07 MED ORDER — ZOLPIDEM TARTRATE 10 MG PO TABS
10.0000 mg | ORAL_TABLET | Freq: Every evening | ORAL | 1 refills | Status: DC | PRN
  Filled 2023-02-07: qty 30, 30d supply, fill #0
  Filled 2023-04-18: qty 15, 15d supply, fill #1
  Filled 2023-05-20: qty 15, 15d supply, fill #2
  Filled 2023-07-03: qty 15, 15d supply, fill #3
  Filled 2023-08-05: qty 15, 15d supply, fill #4

## 2023-02-08 ENCOUNTER — Other Ambulatory Visit (HOSPITAL_COMMUNITY): Payer: Self-pay

## 2023-02-12 ENCOUNTER — Other Ambulatory Visit (HOSPITAL_COMMUNITY): Payer: Self-pay

## 2023-03-08 ENCOUNTER — Other Ambulatory Visit: Payer: Self-pay | Admitting: Medical

## 2023-03-08 DIAGNOSIS — Z1211 Encounter for screening for malignant neoplasm of colon: Secondary | ICD-10-CM

## 2023-03-08 DIAGNOSIS — Z1212 Encounter for screening for malignant neoplasm of rectum: Secondary | ICD-10-CM

## 2023-04-19 ENCOUNTER — Other Ambulatory Visit: Payer: Self-pay

## 2023-04-19 ENCOUNTER — Other Ambulatory Visit (HOSPITAL_COMMUNITY): Payer: Self-pay

## 2023-04-22 ENCOUNTER — Other Ambulatory Visit: Payer: Self-pay

## 2023-04-22 ENCOUNTER — Other Ambulatory Visit (HOSPITAL_COMMUNITY): Payer: Self-pay

## 2023-05-21 ENCOUNTER — Other Ambulatory Visit (HOSPITAL_COMMUNITY): Payer: Self-pay

## 2023-06-06 ENCOUNTER — Encounter: Payer: Self-pay | Admitting: Internal Medicine

## 2023-07-04 ENCOUNTER — Other Ambulatory Visit: Payer: Self-pay

## 2023-07-19 ENCOUNTER — Encounter: Payer: BC Managed Care – PPO | Admitting: Medical

## 2023-07-19 NOTE — Progress Notes (Deleted)
 Shingrix Flu Colon or cologuar Ct coronary

## 2023-08-05 ENCOUNTER — Other Ambulatory Visit: Payer: Self-pay

## 2023-09-09 ENCOUNTER — Other Ambulatory Visit: Payer: Self-pay | Admitting: Medical

## 2023-09-10 ENCOUNTER — Other Ambulatory Visit (HOSPITAL_COMMUNITY): Payer: Self-pay

## 2023-09-10 ENCOUNTER — Other Ambulatory Visit: Payer: Self-pay

## 2023-09-10 MED ORDER — ZOLPIDEM TARTRATE 10 MG PO TABS
10.0000 mg | ORAL_TABLET | Freq: Every evening | ORAL | 0 refills | Status: DC | PRN
  Filled 2023-09-10 – 2023-09-11 (×2): qty 15, 15d supply, fill #0
  Filled 2023-10-21: qty 15, 15d supply, fill #1

## 2023-09-11 ENCOUNTER — Other Ambulatory Visit: Payer: Self-pay

## 2023-09-11 ENCOUNTER — Other Ambulatory Visit (HOSPITAL_COMMUNITY): Payer: Self-pay

## 2023-10-21 ENCOUNTER — Other Ambulatory Visit: Payer: Self-pay

## 2023-10-30 ENCOUNTER — Ambulatory Visit (INDEPENDENT_AMBULATORY_CARE_PROVIDER_SITE_OTHER): Payer: Self-pay | Admitting: Medical

## 2023-10-30 ENCOUNTER — Other Ambulatory Visit (HOSPITAL_COMMUNITY): Payer: Self-pay

## 2023-10-30 VITALS — BP 136/84 | HR 74 | Ht 72.0 in | Wt 210.4 lb

## 2023-10-30 DIAGNOSIS — B351 Tinea unguium: Secondary | ICD-10-CM | POA: Insufficient documentation

## 2023-10-30 DIAGNOSIS — E785 Hyperlipidemia, unspecified: Secondary | ICD-10-CM | POA: Insufficient documentation

## 2023-10-30 DIAGNOSIS — R7301 Impaired fasting glucose: Secondary | ICD-10-CM | POA: Insufficient documentation

## 2023-10-30 DIAGNOSIS — Z125 Encounter for screening for malignant neoplasm of prostate: Secondary | ICD-10-CM

## 2023-10-30 DIAGNOSIS — Z0000 Encounter for general adult medical examination without abnormal findings: Secondary | ICD-10-CM | POA: Diagnosis not present

## 2023-10-30 DIAGNOSIS — Z1211 Encounter for screening for malignant neoplasm of colon: Secondary | ICD-10-CM

## 2023-10-30 DIAGNOSIS — Z23 Encounter for immunization: Secondary | ICD-10-CM | POA: Diagnosis not present

## 2023-10-30 DIAGNOSIS — L609 Nail disorder, unspecified: Secondary | ICD-10-CM | POA: Insufficient documentation

## 2023-10-30 LAB — LIPID PANEL

## 2023-10-30 MED ORDER — ZOLPIDEM TARTRATE 10 MG PO TABS
10.0000 mg | ORAL_TABLET | Freq: Every evening | ORAL | 0 refills | Status: DC | PRN
  Filled 2023-10-30: qty 90, 90d supply, fill #0
  Filled 2023-12-09: qty 15, 15d supply, fill #0
  Filled 2024-01-17: qty 15, 15d supply, fill #1
  Filled 2024-02-24: qty 30, 30d supply, fill #2
  Filled 2024-04-03: qty 30, 30d supply, fill #3

## 2023-10-30 MED ORDER — TERBINAFINE HCL 250 MG PO TABS
250.0000 mg | ORAL_TABLET | Freq: Every day | ORAL | 0 refills | Status: DC
  Filled 2023-10-30 – 2024-04-07 (×2): qty 45, 45d supply, fill #0

## 2023-10-30 NOTE — Progress Notes (Signed)
 Subjective:   HPI  Manuel Johnson is a 53 y.o. male who presents for Chief Complaint  Patient presents with   Annual Exam    Fasting cpe, no concerns, not sure if he wants shingles shot or not. Will do cologuard and send back in    Patient Care Team: Hiren Peplinski, Christiane Cowing, PA-C as PCP - General (Family Medicine) Sees dentist Sees eye doctor  Concerns: He has a history of insomnia.  Does well on Ambien .  Pharmacy is only filling #15 tablets per script.   He continues to be deconditioned fingernails and toenails that are abnormal appearing.  They are unsightly, no prior treatment.   Reviewed their medical, surgical, family, social, medication, and allergy history and updated chart as appropriate.  Past Medical History:  Diagnosis Date   Bulging of cervical intervertebral disc    causing left arm pain and weakness per pt   Insomnia 2015   Insomnia    S/P LASIK surgery    Seasonal allergies     Past Surgical History:  Procedure Laterality Date   ANTERIOR CERVICAL DECOMP/DISCECTOMY FUSION N/A 09/09/2019   Procedure: ANTERIOR CERVICAL DECOMPRESSION/DISCECTOMY FUSION C7-T1;  Surgeon: Mort Ards, MD;  Location: [facility] OR;  Service: Orthopedics;  Laterality: N/A;  2.5 hrs   EYE SURGERY     lasik   LASIK  2006    Family History  Problem Relation Age of Onset   Alzheimer's disease Mother    Heart disease Mother        angina   Other Mother        died at 105yo from MVA   Alcohol abuse Father    Cancer Neg Hx    Stroke Neg Hx    Hypertension Neg Hx    Diabetes Neg Hx      Current Outpatient Medications:    aspirin EC 81 MG tablet, Take 81 mg by mouth daily. Swallow whole., Disp: , Rfl:    Calcium-Magnesium 200-50 MG TABS, Take 1 tablet by mouth daily., Disp: , Rfl:    co-enzyme Q-10 30 MG capsule, Take 30 mg by mouth daily., Disp: , Rfl:    fluticasone  (FLONASE ) 50 MCG/ACT nasal spray, SPRAY 2 SPRAYS INTO EACH NOSTRIL EVERY DAY, Disp: , Rfl:    Multiple Vitamin  (MULTIVITAMIN) capsule, Take 1 capsule by mouth daily., Disp: , Rfl:    terbinafine (LAMISIL) 250 MG tablet, Take 1 tablet (250 mg total) by mouth daily., Disp: 45 tablet, Rfl: 0   zolpidem  (AMBIEN ) 10 MG tablet, Take 1 tablet (10 mg total) by mouth at bedtime as needed for sleep., Disp: 90 tablet, Rfl: 0  Allergies  Allergen Reactions   Xanax  [Alprazolam ]     Made him feel sad, withdrawal type reaction   Prednisone  Hives    Remote past, questionable allergy   Review of Systems  Constitutional:  Negative for chills, fever, malaise/fatigue and weight loss.  HENT:  Negative for congestion, ear pain, hearing loss, sore throat and tinnitus.   Eyes:  Negative for blurred vision, pain and redness.  Respiratory:  Negative for cough, hemoptysis and shortness of breath.   Cardiovascular:  Negative for chest pain, palpitations, orthopnea, claudication and leg swelling.  Gastrointestinal:  Negative for abdominal pain, blood in stool, constipation, diarrhea, nausea and vomiting.  Genitourinary:  Negative for dysuria, flank pain, frequency, hematuria and urgency.  Musculoskeletal:  Negative for falls, joint pain and myalgias.  Skin:  Negative for itching and rash.       Nail  deformity  Neurological:  Negative for dizziness, tingling, speech change, weakness and headaches.  Endo/Heme/Allergies:  Negative for polydipsia. Does not bruise/bleed easily.  Psychiatric/Behavioral:  Negative for depression and memory loss. The patient is not nervous/anxious and does not have insomnia.         10/30/2023    9:30 AM 07/13/2022    8:24 AM 07/05/2021    9:31 AM 05/16/2021    1:42 PM 03/06/2019    1:53 PM  Depression screen PHQ 2/9  Decreased Interest 0 0 0 0 0  Down, Depressed, Hopeless 0 0 0 0 0  PHQ - 2 Score 0 0 0 0 0        Objective:  BP 136/84   Pulse 74   Ht 6' (1.829 m)   Wt 210 lb 6.4 oz (95.4 kg)   BMI 28.54 kg/m   General appearance: alert, no distress, WD/WN, Caucasian male Skin:  Bilateral thumb nails and ring finger nails with thickening of the nails with abnormality but no discoloration, toenails on the left foot and several of the right fifth toenails with some brownish coloration and now suggestive of onychomycosis Right lateral thigh superiorly with 3 separate skin lesions approximately 4 to 5 mm oval-shaped flesh-colored, normal border, no suspicious coloration, several seborrheic keratoses upper mid to left back HEENT: normocephalic, conjunctiva/corneas normal, sclerae anicteric, PERRLA, EOMi Neck: supple, no lymphadenopathy, no thyromegaly, no masses, normal ROM, no bruits Chest: non tender, normal shape and expansion Heart: RRR, normal S1, S2, no murmurs Lungs: CTA bilaterally, no wheezes, rhonchi, or rales Abdomen: +bs, soft, non tender, non distended, no masses, no hepatomegaly, no splenomegaly, no bruits Back: nontender, no scoliosis Musculoskeletal: upper extremities non tender, no obvious deformity, normal ROM throughout, lower extremities non tender, no obvious deformity, normal ROM throughout Extremities: no edema, no cyanosis, no clubbing Pulses: 2+ symmetric, upper and lower extremities, normal cap refill Neurological: alert, oriented x 3, CN2-12 intact, strength normal upper extremities and lower extremities, sensation normal throughout, DTRs 2+ throughout, no cerebellar signs, gait normal Psychiatric: normal affect, behavior normal, pleasant  GU/rectal -anus normal tone, prostate moderately enlarged, no nodules Male genitalia, uncircumcised, no mass, no lymphadenopathy   Assessment and Plan :   Encounter Diagnoses  Name Primary?   Encounter for health maintenance examination in adult Yes   Screening for prostate cancer    Impaired fasting blood sugar    Hyperlipidemia, unspecified hyperlipidemia type    Screening for colon cancer    Need for pneumococcal 20-valent conjugate vaccination    Onychomycosis    Fingernail abnormalities     This  visit was a preventative care visit, also known as wellness visit or routine physical.   Topics typically include healthy lifestyle, diet, exercise, preventative care, vaccinations, sick and well care, proper use of emergency dept and after hours care, as well as other concerns.     Recommendations: Continue to return yearly for your annual wellness and preventative care visits.  This gives us  a chance to discuss healthy lifestyle, exercise, vaccinations, review your chart record, and perform screenings where appropriate.  I recommend you see your eye doctor yearly for routine vision care.  I recommend you see your dentist yearly for routine dental care including hygiene visits twice yearly.   Vaccination recommendations were reviewed Immunization History  Administered Date(s) Administered   Influenza,inj,Quad PF,6+ Mos 03/10/2014, 03/06/2019   Tdap 12/13/2014, 06/25/2020    Counseled on the pneumococcal vaccine.  Vaccine information sheet given.  Pneumococcal vaccine Prevnar  20 given after consent obtained.  Return at your convenience for Shingrix    Screening for cancer: Colon cancer screening: Turn in the Cologuard soon!!!  We discussed PSA, prostate exam, and prostate cancer screening risks/benefits.     Skin cancer screening: Check your skin regularly for new changes, growing lesions, or other lesions of concern Come in for evaluation if you have skin lesions of concern.  Lung cancer screening: If you have a greater than 20 pack year history of tobacco use, then you may qualify for lung cancer screening with a chest CT scan.   Please call your insurance company to inquire about coverage for this test.  We currently don't have screenings for other cancers besides breast, cervical, colon, and lung cancers.  If you have a strong family history of cancer or have other cancer screening concerns, please let me know.    Bone health: Get at least 150 minutes of aerobic exercise  weekly Get weight bearing exercise at least once weekly Bone density test:  A bone density test is an imaging test that uses a type of X-ray to measure the amount of calcium  and other minerals in your bones. The test may be used to diagnose or screen you for a condition that causes weak or thin bones (osteoporosis), predict your risk for a broken bone (fracture), or determine how well your osteoporosis treatment is working. The bone density test is recommended for females 65 and older, or females or males <65 if certain risk factors such as thyroid disease, long term use of steroids such as for asthma or rheumatological issues, vitamin D deficiency, estrogen deficiency, family history of osteoporosis, self or family history of fragility fracture in first degree relative.    Heart health: Get at least 150 minutes of aerobic exercise weekly Limit alcohol It is important to maintain a healthy blood pressure and healthy cholesterol numbers  Heart disease screening: Screening for heart disease includes screening for blood pressure, fasting lipids, glucose/diabetes screening, BMI height to weight ratio, reviewed of smoking status, physical activity, and diet.    Goals include blood pressure 120/80 or less, maintaining a healthy lipid/cholesterol profile, preventing diabetes or keeping diabetes numbers under good control, not smoking or using tobacco products, exercising most days per week or at least 150 minutes per week of exercise, and eating healthy variety of fruits and vegetables, healthy oils, and avoiding unhealthy food choices like fried food, fast food, high sugar and high cholesterol foods.    Other tests may possibly include EKG test, CT coronary calcium  score, echocardiogram, exercise treadmill stress test.   Consider CT coronary calcium  score, $95 cash    Medical care options: I recommend you continue to seek care here first for routine care.  We try really hard to have available  appointments Monday through Friday daytime hours for sick visits, acute visits, and physicals.  Urgent care should be used for after hours and weekends for significant issues that cannot wait till the next day.  The emergency department should be used for significant potentially life-threatening emergencies.  The emergency department is expensive, can often have long wait times for less significant concerns, so try to utilize primary care, urgent care, or telemedicine when possible to avoid unnecessary trips to the emergency department.  Virtual visits and telemedicine have been introduced since the pandemic started in 2020, and can be convenient ways to receive medical care.  We offer virtual appointments as well to assist you in a variety of options to  seek medical care.   Advanced Directives: I recommend you consider completing a Health Care Power of Attorney and Living Will.   These documents respect your wishes and help alleviate burdens on your loved ones if you were to become terminally ill or be in a position to need those documents enforced.    You can complete Advanced Directives yourself, have them notarized, then have copies made for our office, for you and for anybody you feel should have them in safe keeping.  Or, you can have an attorney prepare these documents.   If you haven't updated your Last Will and Testament in a while, it may be worthwhile having an attorney prepare these documents together and save on some costs.       Separate significant issues discussed: Insomnia-does fine on Ambien , he is aware of risks/benefits  Nail deformity Consider dermatology consult Begin Lamisil oral tablet.  F/u 6 weeks   Manuel Johnson was seen today for annual exam.  Diagnoses and all orders for this visit:  Encounter for health maintenance examination in adult -     CBC -     Comprehensive metabolic panel with GFR -     Lipid panel -     PSA -     Hemoglobin A1c -     TSH  Screening for  prostate cancer -     PSA  Impaired fasting blood sugar -     Hemoglobin A1c  Hyperlipidemia, unspecified hyperlipidemia type -     Lipid panel  Screening for colon cancer  Need for pneumococcal 20-valent conjugate vaccination  Onychomycosis  Fingernail abnormalities  Other orders -     terbinafine (LAMISIL) 250 MG tablet; Take 1 tablet (250 mg total) by mouth daily.   Follow-up pending labs, yearly for physical

## 2023-10-30 NOTE — Addendum Note (Signed)
 Addended by: Charliene Conte A on: 10/30/2023 10:28 AM   Modules accepted: Orders

## 2023-10-31 ENCOUNTER — Ambulatory Visit: Payer: Self-pay | Admitting: Medical

## 2023-10-31 LAB — CBC
Hematocrit: 46.1 % (ref 37.5–51.0)
Hemoglobin: 15.1 g/dL (ref 13.0–17.7)
MCH: 30 pg (ref 26.6–33.0)
MCHC: 32.8 g/dL (ref 31.5–35.7)
MCV: 92 fL (ref 79–97)
Platelets: 380 10*3/uL (ref 150–450)
RBC: 5.04 x10E6/uL (ref 4.14–5.80)
RDW: 13 % (ref 11.6–15.4)
WBC: 10.8 10*3/uL (ref 3.4–10.8)

## 2023-10-31 LAB — LIPID PANEL
Cholesterol, Total: 279 mg/dL — ABNORMAL HIGH (ref 100–199)
HDL: 65 mg/dL (ref >39)
LDL CALC COMMENT:: 4.3 ratio (ref 0.0–5.0)
LDL Chol Calc (NIH): 190 mg/dL — ABNORMAL HIGH (ref 0–99)
Triglycerides: 132 mg/dL (ref 0–149)
VLDL Cholesterol Cal: 24 mg/dL (ref 5–40)

## 2023-10-31 LAB — COMPREHENSIVE METABOLIC PANEL WITH GFR
ALT: 49 IU/L — ABNORMAL HIGH (ref 0–44)
AST: 34 IU/L (ref 0–40)
Albumin: 4.9 g/dL (ref 3.8–4.9)
Alkaline Phosphatase: 66 IU/L (ref 44–121)
BUN/Creatinine Ratio: 18 (ref 9–20)
BUN: 17 mg/dL (ref 6–24)
Bilirubin Total: 0.5 mg/dL (ref 0.0–1.2)
CO2: 21 mmol/L (ref 20–29)
Calcium: 10 mg/dL (ref 8.7–10.2)
Chloride: 99 mmol/L (ref 96–106)
Creatinine, Ser: 0.94 mg/dL (ref 0.76–1.27)
Globulin, Total: 2.5 g/dL (ref 1.5–4.5)
Glucose: 102 mg/dL — ABNORMAL HIGH (ref 70–99)
Potassium: 4.4 mmol/L (ref 3.5–5.2)
Sodium: 137 mmol/L (ref 134–144)
Total Protein: 7.4 g/dL (ref 6.0–8.5)
eGFR: 98 mL/min/{1.73_m2} (ref >59)

## 2023-10-31 LAB — PSA: Prostate Specific Ag, Serum: 0.9 ng/mL (ref 0.0–4.0)

## 2023-10-31 LAB — HEMOGLOBIN A1C
Est. average glucose Bld gHb Est-mCnc: 117 mg/dL
Hgb A1c MFr Bld: 5.7 % — ABNORMAL HIGH (ref 4.8–5.6)

## 2023-10-31 LAB — TSH: TSH: 1.84 u[IU]/mL (ref 0.450–4.500)

## 2023-10-31 NOTE — Progress Notes (Signed)
  Liver test slightly elevated.   Kidney and electrolytes normal.   Glucose a little elevated and diabetes marker at risk for diabetes.  Cholesterol too high, worse than prior.  Blood counts normal  PSA prostate marker normal. Thyroid normal   I recommend the CT coronary test screening, $95.   I recommend starting cholesterol medicaiton to lower your heart disease risk  Limit alcohol to 1 or less drink daily, or on weekends spread out alcohol by several hours between drinks and limit to preferably 2 or less drinks on a given day  See if agreeable to CT coronary and Crestor?  Lets recheck liver test in 1-2 months

## 2023-11-01 ENCOUNTER — Other Ambulatory Visit: Payer: Self-pay | Admitting: Medical

## 2023-11-01 ENCOUNTER — Other Ambulatory Visit (HOSPITAL_COMMUNITY): Payer: Self-pay

## 2023-11-01 DIAGNOSIS — Z136 Encounter for screening for cardiovascular disorders: Secondary | ICD-10-CM

## 2023-11-01 MED ORDER — ROSUVASTATIN CALCIUM 10 MG PO TABS
10.0000 mg | ORAL_TABLET | Freq: Every day | ORAL | 3 refills | Status: AC
  Filled 2023-11-01 – 2024-04-07 (×2): qty 90, 90d supply, fill #0

## 2023-11-02 ENCOUNTER — Other Ambulatory Visit (HOSPITAL_COMMUNITY): Payer: Self-pay

## 2023-11-11 ENCOUNTER — Other Ambulatory Visit (HOSPITAL_COMMUNITY): Payer: Self-pay

## 2023-11-12 ENCOUNTER — Other Ambulatory Visit (HOSPITAL_COMMUNITY): Payer: Self-pay

## 2023-11-27 ENCOUNTER — Ambulatory Visit: Payer: Self-pay | Admitting: Medical

## 2023-11-27 ENCOUNTER — Ambulatory Visit (HOSPITAL_COMMUNITY)
Admission: RE | Admit: 2023-11-27 | Discharge: 2023-11-27 | Disposition: A | Discharge status: Home / Self Care | Payer: Self-pay | Source: Ambulatory Visit | Attending: Medical | Admitting: Medical

## 2023-11-27 DIAGNOSIS — Z136 Encounter for screening for cardiovascular disorders: Secondary | ICD-10-CM | POA: Insufficient documentation

## 2023-11-27 NOTE — Progress Notes (Signed)
 Results sent through MyChart

## 2023-12-04 ENCOUNTER — Other Ambulatory Visit

## 2023-12-10 ENCOUNTER — Other Ambulatory Visit (HOSPITAL_COMMUNITY): Payer: Self-pay

## 2024-01-17 ENCOUNTER — Other Ambulatory Visit: Payer: Self-pay

## 2024-02-24 ENCOUNTER — Other Ambulatory Visit: Payer: Self-pay

## 2024-04-05 ENCOUNTER — Other Ambulatory Visit (HOSPITAL_COMMUNITY): Payer: Self-pay

## 2024-04-06 ENCOUNTER — Other Ambulatory Visit: Payer: Self-pay

## 2024-04-06 ENCOUNTER — Telehealth (HOSPITAL_COMMUNITY): Payer: Self-pay

## 2024-04-06 ENCOUNTER — Other Ambulatory Visit (HOSPITAL_COMMUNITY): Payer: Self-pay

## 2024-04-06 NOTE — Telephone Encounter (Signed)
 Pharmacy Patient Advocate Encounter   Received notification from Patient Pharmacy that prior authorization for Zolpidem  Tartrate 10MG  tablets  is required/requested.   Insurance verification completed.   The patient is insured through [employer] [facility].   Per test claim: PA required; PA submitted to above mentioned insurance via Latent Key/confirmation #/EOC [id number] Status is pending

## 2024-04-06 NOTE — Telephone Encounter (Signed)
 Pharmacy Patient Advocate Encounter  Received notification from [facility] that Prior Authorization for  Zolpidem  Tartrate 10MG  tablets  has been CANCELLED due to the insurance will not cover more than 15 tablets per 30 days.   PA #/Case ID/Reference #: [id number]

## 2024-04-08 ENCOUNTER — Other Ambulatory Visit (HOSPITAL_COMMUNITY): Payer: Self-pay

## 2024-04-18 ENCOUNTER — Other Ambulatory Visit (HOSPITAL_COMMUNITY): Payer: Self-pay

## 2024-06-09 ENCOUNTER — Other Ambulatory Visit: Payer: Self-pay | Admitting: Medical

## 2024-06-10 ENCOUNTER — Other Ambulatory Visit: Payer: Self-pay

## 2024-06-10 MED ORDER — ZOLPIDEM TARTRATE 10 MG PO TABS
10.0000 mg | ORAL_TABLET | Freq: Every evening | ORAL | 0 refills | Status: AC | PRN
  Filled 2024-06-10: qty 30, 30d supply, fill #0

## 2024-06-30 ENCOUNTER — Ambulatory Visit: Payer: Self-pay

## 2024-06-30 NOTE — Telephone Encounter (Signed)
 FYI Only or Action Required?: FYI only for provider: appointment scheduled on 1/28.  Patient was last seen in primary care on 10/30/2023 by Bulah Alm RAMAN, PA-C.  Called Nurse Triage reporting Cough.  Symptoms began x 3 weeks.  Interventions attempted: OTC medications: Mucinex , Sudafed.  Symptoms are: unchanged.  Triage Disposition: See Physician Within 24 Hours  Patient/caregiver understands and will follow disposition?: Yes     Message from Hadassah PARAS sent at 06/30/2024  2:32 PM EST  Reason for Triage: Worsening Upper respiratory infection, chest crackling, energy is down. Transferring to NT   Reason for Disposition  [1] Continuous (nonstop) coughing interferes with work or school AND [2] no improvement using cough treatment per Care Advice  Answer Assessment - Initial Assessment Questions 1. ONSET: When did the cough begin?      X 3 weeks ago   2. SEVERITY: How bad is the cough today?      Intermittent   3. SPUTUM: Describe the color of your sputum (e.g., none, dry cough; clear, white, yellow, green)     Yellow  4. HEMOPTYSIS: Are you coughing up any blood? If Yes, ask: How much? (e.g., flecks, streaks, tablespoons, etc.)     No   5. DIFFICULTY BREATHING: Are you having difficulty breathing? If Yes, ask: How bad is it? (e.g., mild, moderate, severe)      No   6. FEVER: Do you have a fever? If Yes, ask: What is your temperature, how was it measured, and when did it start?     No   7. CARDIAC HISTORY: Do you have any history of heart disease? (e.g., heart attack, congestive heart failure)      No   8. LUNG HISTORY: Do you have any history of lung disease?  (e.g., pulmonary embolus, asthma, emphysema)     No   10. OTHER SYMPTOMS: Do you have any other symptoms? (e.g., runny nose, wheezing, chest pain)       Chest crackles.     Patient called in to triage with complaints of productive cough, fatigue. This has been ongoing for 3  weeks. The patient stated he is also fatigued.  For home care, the patient is taking Mucinex , Sudafed.  Appointment scheduled for further evaluation; Patient agrees with the plan of care, and will reach out if symptoms worsen or persist.  Protocols used: Cough - Acute Productive-A-AH

## 2024-07-01 ENCOUNTER — Other Ambulatory Visit (HOSPITAL_COMMUNITY): Payer: Self-pay

## 2024-07-01 ENCOUNTER — Ambulatory Visit: Payer: Self-pay | Admitting: Medical

## 2024-07-01 ENCOUNTER — Telehealth (HOSPITAL_COMMUNITY): Payer: Self-pay

## 2024-07-01 ENCOUNTER — Other Ambulatory Visit: Payer: Self-pay

## 2024-07-01 VITALS — BP 110/68 | HR 78 | Temp 97.2°F | Resp 16 | Wt 201.2 lb

## 2024-07-01 DIAGNOSIS — J988 Other specified respiratory disorders: Secondary | ICD-10-CM | POA: Diagnosis not present

## 2024-07-01 DIAGNOSIS — R058 Other specified cough: Secondary | ICD-10-CM | POA: Diagnosis not present

## 2024-07-01 DIAGNOSIS — R062 Wheezing: Secondary | ICD-10-CM | POA: Diagnosis not present

## 2024-07-01 DIAGNOSIS — G4700 Insomnia, unspecified: Secondary | ICD-10-CM

## 2024-07-01 MED ORDER — BENZONATATE 200 MG PO CAPS
200.0000 mg | ORAL_CAPSULE | Freq: Three times a day (TID) | ORAL | 0 refills | Status: AC | PRN
  Filled 2024-07-01: qty 30, 10d supply, fill #0

## 2024-07-01 MED ORDER — AZITHROMYCIN 250 MG PO TABS
ORAL_TABLET | ORAL | 0 refills | Status: AC
  Filled 2024-07-01: qty 6, 5d supply, fill #0

## 2024-07-01 MED ORDER — BELSOMRA 15 MG PO TABS
15.0000 mg | ORAL_TABLET | Freq: Every evening | ORAL | 2 refills | Status: AC | PRN
  Filled 2024-07-01 (×2): qty 30, 30d supply, fill #0

## 2024-07-01 MED ORDER — ALBUTEROL SULFATE HFA 108 (90 BASE) MCG/ACT IN AERS
2.0000 | INHALATION_SPRAY | Freq: Four times a day (QID) | RESPIRATORY_TRACT | 0 refills | Status: AC | PRN
  Filled 2024-07-01: qty 6.7, 30d supply, fill #0

## 2024-07-01 NOTE — Telephone Encounter (Signed)
 Pharmacy Patient Advocate Encounter   Received notification from Pt Calls Messages that prior authorization for Belsomra  15MG  tablets  is required/requested.   Insurance verification completed.   The patient is insured through [facility].   Per test claim: PA required; PA submitted to above mentioned insurance via Latent Key/confirmation #/EOC [id number] Status is pending

## 2024-07-01 NOTE — Progress Notes (Signed)
 "  Name: Manuel Johnson   Date of Visit: 07/01/24   Date of last visit with me: 06/09/2024   CHIEF COMPLAINT:  Chief Complaint  Patient presents with   Cough    Cough x 3 weeks. Wet chest,used OTC mucinex  DM and Sudafed, when laying down feels a crackling in his chest.     Consult    Discuss ambien  and sleep issues       HPI:  Discussed the use of AI scribe software for clinical note transcription with the patient, who gave verbal consent to proceed.  History of Present Illness  Manuel Johnson is a 54 year old male who presents with a three-week history of upper respiratory infection symptoms.  He has been experiencing a 'wet chest' sensation, lack of energy, and a productive cough with dark brown sputum for the past three weeks. Over-the-counter medications have not provided relief, and symptoms have progressively worsened. No fever, no aches or chills.     He had a previous sore throat which has since resolved. He experiences head pressure and drainage down the back of his throat, which is somewhat alleviated by daily use of Sudafed. Attempts to stop Sudafed result in a return of headaches.  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 He denies any history of inhaler use in the past.  He report a crackling noise in his chest, particularly at night, which disrupts his sleep.  For sleep, he has been using Ambien  but finds it only helps him fall asleep, not stay asleep. He has tried other medications in the past, including Xanax , trazodone , and Lunesta , with varying effects. He is currently taking half an Ambien  to avoid next-day drowsiness but still wakes up in the middle of the night.   No other aggravating or relieving factors. No other complaint.  Past Medical History:  Diagnosis Date   Bulging of cervical intervertebral disc    causing left arm pain and weakness per pt   Insomnia 2015   Insomnia    S/P LASIK surgery     Seasonal allergies    Current Outpatient Medications on File Prior to Visit  Medication Sig Dispense Refill   aspirin EC 81 MG tablet Take 81 mg by mouth daily. Swallow whole.     Calcium -Magnesium 200-50 MG TABS Take 1 tablet by mouth daily.     co-enzyme Q-10 30 MG capsule Take 30 mg by mouth daily.     fluticasone  (FLONASE ) 50 MCG/ACT nasal spray SPRAY 2 SPRAYS INTO EACH NOSTRIL EVERY DAY     Multiple Vitamin (MULTIVITAMIN) capsule Take 1 capsule by mouth daily.     rosuvastatin  (CRESTOR ) 10 MG tablet Take 1 tablet (10 mg total) by mouth daily. 90 tablet 3   zolpidem  (AMBIEN ) 10 MG tablet Take 1 tablet (10 mg total) by mouth at bedtime as needed for sleep. 90 tablet 0   No current facility-administered medications on file prior to visit.     Objective: BP 110/68   Pulse 78   Temp (!) 97.2 F (36.2 C)   Resp 16   Wt 201 lb 3.2 oz (91.3 kg)   SpO2 99%   BMI 27.29 kg/m   General appearence: alert, no distress, WD/WN,  HEENT: normocephalic, sclerae anicteric, TMs pearly, nares with dry mucoid discharge, + erythema, pharynx normal Oral cavity: MMM, no lesions Neck: supple, no lymphadenopathy, no thyromegaly, no masses Heart: RRR, normal S1, S2, no murmurs Lungs: +rhonchi, no dullness,  no wheezes,  no rales Psych: pleasant, good eye contact, answers questions appropriately     Assessment: Encounter Diagnoses  Name Primary?   Productive cough Yes   Respiratory tract infection    Wheezing    Insomnia, unspecified type      Plan:  Acute respiratory infection, mild wheezing, no hx/o asthma Three-week history of worsening symptoms with productive cough and crackles. Considered bacterial infection due to duration and severity. - Prescribed Z-Pak (azithromycin ) for five days. - Prescribed Tessalon  Perles for cough suppression, two to three times daily. - Advised continued use of OTC medications for congestion as needed. - Encouraged increased water intake. - prescribed  inhaler use if wheezing or crackles persist. - Consider prednisone  if no improvement by Friday afternoon.  Insomnia Waking up at night despite Ambien  use. Previous medications had varying success. Discussed alternative sleep aids and CBT as primary treatment.  Prior failures include trazodone , lunesta , Ambien , Gabapentin , xanax  and OTC medication. - Prescribed belsomra  15 mg, pending insurance approval. - Discussed potential need for prior authorization for belsomra . - Consider CBT for insomnia management.    Axten was seen today for cough and consult.  Diagnoses and all orders for this visit:  Productive cough  Respiratory tract infection  Wheezing  Insomnia, unspecified type  Other orders -     azithromycin  (ZITHROMAX ) 250 MG tablet; Take 2 tablets (500 mg) by mouth on day 1, then take 1 tablet (250 mg) by mouth on days 2-4. -     benzonatate  (TESSALON ) 200 MG capsule; Take 1 capsule (200 mg total) by mouth 3 (three) times daily as needed for cough. -     albuterol  (VENTOLIN  HFA) 108 (90 Base) MCG/ACT inhaler; Inhale 2 puffs into the lungs every 6 (six) hours as needed for wheezing or shortness of breath. -     Suvorexant  (BELSOMRA ) 15 MG TABS; Take 1 tablet (15 mg total) by mouth at bedtime as needed.    F/u prn    "

## 2024-07-01 NOTE — Telephone Encounter (Signed)
 Pharmacy Patient Advocate Encounter  Received notification from [facility] that Prior Authorization for  Belsomra  15MG  tablets  has been APPROVED from 07/01/24 to 12/29/24. Ran test claim, Copay is $30 with a copay card. This test claim was processed through [facility]- copay amounts may vary at other pharmacies due to pharmacy/plan contracts, or as the patient moves through the different stages of their insurance plan.   PA #/Case ID/Reference #: [id number]

## 2024-07-01 NOTE — Telephone Encounter (Signed)
 PA request has been Received. New Encounter has been or will be created for follow up. For additional info see Pharmacy Prior Auth telephone encounter from 07/01/24.

## 2024-07-03 ENCOUNTER — Other Ambulatory Visit (HOSPITAL_COMMUNITY): Payer: Self-pay

## 2024-07-03 ENCOUNTER — Other Ambulatory Visit: Payer: Self-pay | Admitting: Medical

## 2024-07-03 MED ORDER — PREDNISONE 10 MG PO TABS
ORAL_TABLET | ORAL | 0 refills | Status: AC
  Filled 2024-07-03: qty 21, 6d supply, fill #0

## 2024-07-03 MED ORDER — BELSOMRA 15 MG PO TABS
1.0000 | ORAL_TABLET | Freq: Every day | ORAL | 2 refills | Status: DC
  Filled 2024-07-03: qty 30, 30d supply, fill #0

## 2024-07-03 MED ORDER — BELSOMRA 15 MG PO TABS
1.0000 | ORAL_TABLET | Freq: Every day | ORAL | 2 refills | Status: AC
  Filled 2024-07-03: qty 30, 30d supply, fill #0
